# Patient Record
Sex: Female | Born: 1950 | Hispanic: Yes | Marital: Married | Smoking: Never smoker
Health system: Southern US, Community
[De-identification: ages and names within clinical notes are randomized; demographics above are authoritative.]

## PROBLEM LIST (undated history)

## (undated) DIAGNOSIS — Z8719 Personal history of other diseases of the digestive system: Secondary | ICD-10-CM

## (undated) DIAGNOSIS — R1310 Dysphagia, unspecified: Secondary | ICD-10-CM

## (undated) DIAGNOSIS — R51 Headache: Secondary | ICD-10-CM

## (undated) DIAGNOSIS — H269 Unspecified cataract: Secondary | ICD-10-CM

## (undated) DIAGNOSIS — R161 Splenomegaly, not elsewhere classified: Secondary | ICD-10-CM

## (undated) DIAGNOSIS — K219 Gastro-esophageal reflux disease without esophagitis: Secondary | ICD-10-CM

## (undated) DIAGNOSIS — I10 Essential (primary) hypertension: Secondary | ICD-10-CM

## (undated) DIAGNOSIS — E559 Vitamin D deficiency, unspecified: Secondary | ICD-10-CM

## (undated) DIAGNOSIS — J189 Pneumonia, unspecified organism: Secondary | ICD-10-CM

## (undated) DIAGNOSIS — R519 Headache, unspecified: Secondary | ICD-10-CM

## (undated) DIAGNOSIS — F32A Depression, unspecified: Secondary | ICD-10-CM

## (undated) DIAGNOSIS — R600 Localized edema: Secondary | ICD-10-CM

## (undated) DIAGNOSIS — I499 Cardiac arrhythmia, unspecified: Secondary | ICD-10-CM

## (undated) DIAGNOSIS — F329 Major depressive disorder, single episode, unspecified: Secondary | ICD-10-CM

## (undated) DIAGNOSIS — R7303 Prediabetes: Secondary | ICD-10-CM

## (undated) DIAGNOSIS — K7460 Unspecified cirrhosis of liver: Secondary | ICD-10-CM

---

## 2006-09-08 HISTORY — PX: EYE SURGERY: SHX253

## 2015-03-30 ENCOUNTER — Other Ambulatory Visit: Payer: Self-pay | Admitting: Family Medicine

## 2015-03-30 DIAGNOSIS — Z1231 Encounter for screening mammogram for malignant neoplasm of breast: Secondary | ICD-10-CM

## 2015-05-24 ENCOUNTER — Ambulatory Visit: Payer: Self-pay | Attending: Family Medicine

## 2015-10-23 ENCOUNTER — Other Ambulatory Visit: Payer: Self-pay | Admitting: Geriatric Medicine

## 2015-10-23 DIAGNOSIS — Z13820 Encounter for screening for osteoporosis: Secondary | ICD-10-CM

## 2015-10-23 DIAGNOSIS — Z1231 Encounter for screening mammogram for malignant neoplasm of breast: Secondary | ICD-10-CM

## 2015-11-21 ENCOUNTER — Ambulatory Visit: Payer: Self-pay | Attending: Geriatric Medicine

## 2016-01-25 ENCOUNTER — Encounter: Payer: Self-pay | Admitting: *Deleted

## 2016-01-28 ENCOUNTER — Encounter
Admission: RE | Disposition: A | Discharge status: Home / Self Care | Payer: Self-pay | Source: Ambulatory Visit | Attending: Gastroenterology

## 2016-01-28 ENCOUNTER — Ambulatory Visit: Payer: Medicare (Managed Care) | Admitting: Certified Registered Nurse Anesthetist

## 2016-01-28 ENCOUNTER — Ambulatory Visit
Admission: RE | Admit: 2016-01-28 | Discharge: 2016-01-28 | Disposition: A | Discharge status: Home / Self Care | Payer: Medicare (Managed Care) | Source: Ambulatory Visit | Attending: Gastroenterology | Admitting: Gastroenterology

## 2016-01-28 ENCOUNTER — Encounter: Payer: Self-pay | Admitting: *Deleted

## 2016-01-28 DIAGNOSIS — R1310 Dysphagia, unspecified: Secondary | ICD-10-CM | POA: Diagnosis present

## 2016-01-28 DIAGNOSIS — I10 Essential (primary) hypertension: Secondary | ICD-10-CM | POA: Diagnosis not present

## 2016-01-28 DIAGNOSIS — Z1211 Encounter for screening for malignant neoplasm of colon: Secondary | ICD-10-CM | POA: Diagnosis not present

## 2016-01-28 DIAGNOSIS — F329 Major depressive disorder, single episode, unspecified: Secondary | ICD-10-CM | POA: Insufficient documentation

## 2016-01-28 DIAGNOSIS — E559 Vitamin D deficiency, unspecified: Secondary | ICD-10-CM | POA: Insufficient documentation

## 2016-01-28 DIAGNOSIS — Z79899 Other long term (current) drug therapy: Secondary | ICD-10-CM | POA: Insufficient documentation

## 2016-01-28 HISTORY — PX: COLONOSCOPY WITH PROPOFOL: SHX5780

## 2016-01-28 HISTORY — DX: Major depressive disorder, single episode, unspecified: F32.9

## 2016-01-28 HISTORY — DX: Vitamin D deficiency, unspecified: E55.9

## 2016-01-28 HISTORY — PX: ESOPHAGOGASTRODUODENOSCOPY (EGD) WITH PROPOFOL: SHX5813

## 2016-01-28 HISTORY — DX: Essential (primary) hypertension: I10

## 2016-01-28 HISTORY — DX: Depression, unspecified: F32.A

## 2016-01-28 SURGERY — COLONOSCOPY WITH PROPOFOL
Anesthesia: General

## 2016-01-28 MED ORDER — PROPOFOL 500 MG/50ML IV EMUL
INTRAVENOUS | Status: DC | PRN
  Administered 2016-01-28: 120 ug/kg/min via INTRAVENOUS

## 2016-01-28 MED ORDER — SODIUM CHLORIDE 0.9 % IV SOLN
INTRAVENOUS | Status: DC
Start: 2016-01-28 — End: 2016-01-28
  Administered 2016-01-28: 09:00:00 via INTRAVENOUS

## 2016-01-28 MED ORDER — PROPOFOL 10 MG/ML IV BOLUS
INTRAVENOUS | Status: DC | PRN
  Administered 2016-01-28: 40 mg via INTRAVENOUS
  Administered 2016-01-28: 20 mg via INTRAVENOUS

## 2016-01-28 MED ORDER — LIDOCAINE HCL (CARDIAC) 20 MG/ML IV SOLN
INTRAVENOUS | Status: DC | PRN
  Administered 2016-01-28: 100 mg via INTRAVENOUS

## 2016-01-28 MED ORDER — SODIUM CHLORIDE 0.9 % IV SOLN
INTRAVENOUS | Status: DC
Start: 2016-01-28 — End: 2016-01-28

## 2016-01-28 MED ORDER — FENTANYL CITRATE (PF) 100 MCG/2ML IJ SOLN
INTRAMUSCULAR | Status: DC | PRN
  Administered 2016-01-28: 50 ug via INTRAVENOUS

## 2016-01-28 NOTE — Transfer of Care (Signed)
Immediate Anesthesia Transfer of Care Note  Patient: Lindsay Baker  Procedure(s) Performed: Procedure(s): COLONOSCOPY WITH PROPOFOL (N/A) ESOPHAGOGASTRODUODENOSCOPY (EGD) WITH PROPOFOL (N/A)  Patient Location: PACU  Anesthesia Type:General  Level of Consciousness: sedated  Airway & Oxygen Therapy: Patient Spontanous Breathing and Patient connected to nasal cannula oxygen  Post-op Assessment: Report given to RN and Post -op Vital signs reviewed and stable  Post vital signs: Reviewed and stable  Last Vitals:  Filed Vitals:   01/28/16 0845  BP: 141/61  Pulse: 59  Temp: 36.2 C  Resp: 16    Last Pain: There were no vitals filed for this visit.       Complications: No apparent anesthesia complications

## 2016-01-28 NOTE — Op Note (Signed)
[facility] Gastroenterology Patient Name: Lindsay BunchMa Rojas De Jimenez Procedure Date: 01/28/2016 9:03 AM MRN: [id number] Account #: [id number] Date of Birth: Jan 06, 1951 Admit Type: Outpatient Age: 65 [facility] 4 Gender: Female Note Status: Finalized Procedure:            Colonoscopy Indications:          Screening for colorectal malignant neoplasm Providers:            Ezzard StandingPaul Y. Bluford Kaufmannh, MD Medicines:            Monitored Anesthesia Care Complications:        No immediate complications. Procedure:            Pre-Anesthesia Assessment:                       - Prior to the procedure, a History and Physical was                        performed, and patient medications, allergies and                        sensitivities were reviewed. The patient's tolerance of                        previous anesthesia was reviewed.                       - The risks and benefits of the procedure and the                        sedation options and risks were discussed with the                        patient. All questions were answered and informed                        consent was obtained.                       - After reviewing the risks and benefits, the patient                        was deemed in satisfactory condition to undergo the                        procedure.                       After obtaining informed consent, the colonoscope was                        passed under direct vision. Throughout the procedure,                        the patient's blood pressure, pulse, and oxygen                        saturations were monitored continuously. The                        Colonoscope was introduced through the anus and  advanced to the the cecum, identified by appendiceal                        orifice and ileocecal valve. The colonoscopy was                        performed without difficulty. The patient tolerated the                        procedure  well. The quality of the bowel preparation                        was poor. Findings:      The colon (entire examined portion) appeared normal.      Solid stools near cecum. Impression:           - Preparation of the colon was poor.                       - The entire examined colon is normal.                       - No specimens collected. Recommendation:       - Discharge patient to home.                       - Repeat colonoscopy in 10 years for surveillance.                       - The findings and recommendations were discussed with                        the patient. Procedure Code(s):    --- Professional ---                       [phone], Colonoscopy, flexible; diagnostic, including                        collection of specimen(s) by brushing or washing, when                        performed (separate procedure) Diagnosis Code(s):    --- Professional ---                       Z12.11, Encounter for screening for malignant neoplasm                        of colon CPT copyright 2016 American Medical Association. All rights reserved. The codes documented in this report are preliminary and upon coder review may  be revised to meet current compliance requirements. Wallace Cullens, MD 01/28/2016 9:26:27 AM This report has been signed electronically. Number of Addenda: 0 Note Initiated On: 01/28/2016 9:03 AM Scope Withdrawal Time: 0 hours 5 minutes 42 seconds  Total Procedure Duration: 0 hours 9 minutes 43 seconds       [facility]

## 2016-01-28 NOTE — Op Note (Signed)
[facility] Gastroenterology Patient Name: Lindsay Baker Procedure Date: 01/28/2016 9:04 AM MRN: [id number] Account #: [id number] Date of Birth: [birth year] Admit Type: Outpatient Age: 65 Room: [facility] ENDO ROOM 4 Gender: Female Note Status: Finalized Procedure:            Upper GI endoscopy Indications:          Dysphagia Providers:            Ezzard Standing. Bluford Kaufmann, MD Medicines:            Monitored Anesthesia Care Complications:        No immediate complications. Procedure:            Pre-Anesthesia Assessment:                       - Prior to the procedure, a History and Physical was                        performed, and patient medications, allergies and                        sensitivities were reviewed. The patient's tolerance of                        previous anesthesia was reviewed.                       - The risks and benefits of the procedure and the                        sedation options and risks were discussed with the                        patient. All questions were answered and informed                        consent was obtained.                       - After reviewing the risks and benefits, the patient                        was deemed in satisfactory condition to undergo the                        procedure.                       After obtaining informed consent, the endoscope was                        passed under direct vision. Throughout the procedure,                        the patient's blood pressure, pulse, and oxygen                        saturations were monitored continuously. The Endoscope                        was introduced through the mouth, and advanced to the  second part of duodenum. The upper GI endoscopy was                        accomplished without difficulty. The patient tolerated                        the procedure well. Findings:      No endoscopic abnormality was evident in the esophagus to  explain the       patient's complaint of dysphagia. It was decided, however, to proceed       with dilation of the entire esophagus. The scope was withdrawn. Dilation       was performed with a Maloney dilator with mild resistance at 54 Fr.      The entire examined stomach was normal.      The examined duodenum was normal. Impression:           - No endoscopic esophageal abnormality to explain                        patient's dysphagia. Esophagus dilated. Dilated.                       - Normal stomach.                       - Normal examined duodenum.                       - No specimens collected. Recommendation:       - Discharge patient to home.                       - Observe patient's clinical course.                       - The findings and recommendations were discussed with                        the patient. Procedure Code(s):    --- Professional ---                       [phone], Esophagogastroduodenoscopy, flexible, transoral;                        diagnostic, including collection of specimen(s) by                        brushing or washing, when performed (separate procedure)                       43450, Dilation of esophagus, by unguided sound or                        bougie, single or multiple passes Diagnosis Code(s):    --- Professional ---                       R13.10, Dysphagia, unspecified CPT copyright 2016 American Medical Association. All rights reserved. The codes documented in this report are preliminary and upon coder review may  be revised to meet current compliance requirements. Wallace Cullens, MD 01/28/2016 9:12:58 AM This report has been signed electronically. Number of  Addenda: 0 Note Initiated On: 01/28/2016 9:04 AM      [facility]

## 2016-01-28 NOTE — Anesthesia Postprocedure Evaluation (Signed)
Anesthesia Post Note  Patient: Lindsay Baker  Procedure(s) Performed: Procedure(s) (LRB): COLONOSCOPY WITH PROPOFOL (N/A) ESOPHAGOGASTRODUODENOSCOPY (EGD) WITH PROPOFOL (N/A)  Patient location during evaluation: PACU Anesthesia Type: General Level of consciousness: awake and alert Pain management: pain level controlled Vital Signs Assessment: post-procedure vital signs reviewed and stable Respiratory status: spontaneous breathing, nonlabored ventilation, respiratory function stable and patient connected to nasal cannula oxygen Cardiovascular status: blood pressure returned to baseline and stable Postop Assessment: no signs of nausea or vomiting Anesthetic complications: no    Last Vitals:  Filed Vitals:   01/28/16 0845 01/28/16 0930  BP: 141/61 144/71  Pulse: 59 68  Temp: 36.2 C 36.6 C  Resp: 16 20    Last Pain: There were no vitals filed for this visit.               Yevette EdwardsJames G Adams

## 2016-01-28 NOTE — H&P (Signed)
    [facility] Primary Gastroenterologist:  Dr. Bluford Kaufmannh  Pre-Procedure History & Physical: HPI:  Lindsay Baker is a 65 y.o. female is here for an EGD/colonoscopy.   Past Medical History  Diagnosis Date  . Depression   . Hypertension   . Vitamin D deficiency     No past surgical history on file.  Prior to Admission medications   Medication Sig Start Date End Date Taking? Authorizing Provider  amLODipine (NORVASC) 10 MG tablet Take 10 mg by mouth daily.   Yes Historical Provider, MD  escitalopram (LEXAPRO) 10 MG tablet Take 10 mg by mouth daily.   Yes Historical Provider, MD  hydrochlorothiazide (HYDRODIURIL) 25 MG tablet Take 25 mg by mouth daily.   Yes Historical Provider, MD  lisinopril (PRINIVIL,ZESTRIL) 20 MG tablet Take 20 mg by mouth daily.   Yes Historical Provider, MD  traMADol (ULTRAM) 50 MG tablet Take by mouth every 6 (six) hours as needed.   Yes Historical Provider, MD    Allergies as of 01/18/2016  . (Not on File)    No family history on file.  Social History   Social History  . Marital Status: Married    Spouse Name: N/A  . Number of Children: N/A  . Years of Education: N/A   Occupational History  . Not on file.   Social History Main Topics  . Smoking status: Not on file  . Smokeless tobacco: Not on file  . Alcohol Use: Not on file  . Drug Use: Not on file  . Sexual Activity: Not on file   Other Topics Concern  . Not on file   Social History Narrative  . No narrative on file    Review of Systems: See HPI, otherwise negative ROS  Physical Exam: There were no vitals taken for this visit. General:   Alert,  pleasant and cooperative in NAD Head:  Normocephalic and atraumatic. Neck:  Supple; no masses or thyromegaly. Lungs:  Clear throughout to auscultation.    Heart:  Regular rate and rhythm. Abdomen:  Soft, nontender and nondistended. Normal bowel sounds, without guarding, and without rebound.    Neurologic:  Alert and  oriented x4;  grossly normal neurologically.  Impression/Plan: Tacy A Chandra BatchRojas De Baker is here for an EGD/colonoscopy to be performed for screening, GERD, dysphagia  Risks, benefits, limitations, and alternatives regarding  EGD/colonoscopy} have been reviewed with the patient.  Questions have been answered.  All parties agreeable.   Eldine Rencher, Ezzard StandingPAUL Y, MD  01/28/2016, 8:27 AM

## 2016-01-28 NOTE — Anesthesia Preprocedure Evaluation (Signed)
Anesthesia Evaluation  Patient identified by MRN, date of birth, ID band Patient awake    Reviewed: Allergy & Precautions, H&P , NPO status , Patient's Chart, lab work & pertinent test results, reviewed documented beta blocker date and time   Airway Mallampati: II   Neck ROM: full    Dental  (+) Poor Dentition, Teeth Intact   Pulmonary neg pulmonary ROS,    Pulmonary exam normal        Cardiovascular hypertension, negative cardio ROS Normal cardiovascular exam     Neuro/Psych PSYCHIATRIC DISORDERS negative neurological ROS  negative psych ROS   GI/Hepatic negative GI ROS, Neg liver ROS,   Endo/Other  negative endocrine ROS  Renal/GU negative Renal ROS  negative genitourinary   Musculoskeletal   Abdominal   Peds  Hematology negative hematology ROS (+)   Anesthesia Other Findings Past Medical History:   Depression                                                   Hypertension                                                 Vitamin D deficiency                                       History reviewed. No pertinent surgical history. BMI    Body Mass Index   35.15 kg/m 2     Reproductive/Obstetrics                             Anesthesia Physical Anesthesia Plan  ASA: II  Anesthesia Plan: General   Post-op Pain Management:    Induction:   Airway Management Planned:   Additional Equipment:   Intra-op Plan:   Post-operative Plan:   Informed Consent: I have reviewed the patients History and Physical, chart, labs and discussed the procedure including the risks, benefits and alternatives for the proposed anesthesia with the patient or authorized representative who has indicated his/her understanding and acceptance.   Dental Advisory Given  Plan Discussed with: CRNA  Anesthesia Plan Comments:         Anesthesia Quick Evaluation

## 2016-01-29 ENCOUNTER — Encounter: Payer: Self-pay | Admitting: Gastroenterology

## 2017-04-17 ENCOUNTER — Other Ambulatory Visit: Payer: Self-pay | Admitting: Geriatric Medicine

## 2017-04-17 DIAGNOSIS — Z13820 Encounter for screening for osteoporosis: Secondary | ICD-10-CM

## 2017-05-20 ENCOUNTER — Other Ambulatory Visit: Payer: Self-pay | Admitting: Internal Medicine

## 2017-05-20 DIAGNOSIS — Z1231 Encounter for screening mammogram for malignant neoplasm of breast: Secondary | ICD-10-CM

## 2017-06-11 ENCOUNTER — Ambulatory Visit
Admission: RE | Admit: 2017-06-11 | Discharge: 2017-06-11 | Disposition: A | Discharge status: Home / Self Care | Payer: Medicare (Managed Care) | Source: Ambulatory Visit | Attending: Internal Medicine | Admitting: Internal Medicine

## 2017-06-11 DIAGNOSIS — Z1231 Encounter for screening mammogram for malignant neoplasm of breast: Secondary | ICD-10-CM | POA: Insufficient documentation

## 2017-12-07 DIAGNOSIS — H269 Unspecified cataract: Secondary | ICD-10-CM

## 2017-12-07 HISTORY — DX: Unspecified cataract: H26.9

## 2017-12-15 ENCOUNTER — Encounter: Payer: Self-pay | Admitting: *Deleted

## 2017-12-22 ENCOUNTER — Ambulatory Visit: Payer: Medicare (Managed Care) | Admitting: Anesthesiology

## 2017-12-22 ENCOUNTER — Other Ambulatory Visit: Payer: Self-pay

## 2017-12-22 ENCOUNTER — Encounter: Payer: Self-pay | Admitting: *Deleted

## 2017-12-22 ENCOUNTER — Encounter
Admission: RE | Disposition: A | Discharge status: Home / Self Care | Payer: Self-pay | Source: Ambulatory Visit | Attending: Ophthalmology

## 2017-12-22 ENCOUNTER — Ambulatory Visit
Admission: RE | Admit: 2017-12-22 | Discharge: 2017-12-22 | Disposition: A | Discharge status: Home / Self Care | Payer: Medicare (Managed Care) | Source: Ambulatory Visit | Attending: Ophthalmology | Admitting: Ophthalmology

## 2017-12-22 DIAGNOSIS — R600 Localized edema: Secondary | ICD-10-CM | POA: Diagnosis not present

## 2017-12-22 DIAGNOSIS — I10 Essential (primary) hypertension: Secondary | ICD-10-CM | POA: Insufficient documentation

## 2017-12-22 DIAGNOSIS — E669 Obesity, unspecified: Secondary | ICD-10-CM | POA: Insufficient documentation

## 2017-12-22 DIAGNOSIS — E559 Vitamin D deficiency, unspecified: Secondary | ICD-10-CM | POA: Insufficient documentation

## 2017-12-22 DIAGNOSIS — H2512 Age-related nuclear cataract, left eye: Secondary | ICD-10-CM | POA: Diagnosis present

## 2017-12-22 DIAGNOSIS — Z6833 Body mass index (BMI) 33.0-33.9, adult: Secondary | ICD-10-CM | POA: Insufficient documentation

## 2017-12-22 DIAGNOSIS — K219 Gastro-esophageal reflux disease without esophagitis: Secondary | ICD-10-CM | POA: Diagnosis not present

## 2017-12-22 DIAGNOSIS — F329 Major depressive disorder, single episode, unspecified: Secondary | ICD-10-CM | POA: Diagnosis not present

## 2017-12-22 DIAGNOSIS — Z79899 Other long term (current) drug therapy: Secondary | ICD-10-CM | POA: Diagnosis not present

## 2017-12-22 HISTORY — DX: Gastro-esophageal reflux disease without esophagitis: K21.9

## 2017-12-22 HISTORY — DX: Pneumonia, unspecified organism: J18.9

## 2017-12-22 HISTORY — DX: Prediabetes: R73.03

## 2017-12-22 HISTORY — DX: Localized edema: R60.0

## 2017-12-22 HISTORY — DX: Cardiac arrhythmia, unspecified: I49.9

## 2017-12-22 HISTORY — PX: CATARACT EXTRACTION W/PHACO: SHX586

## 2017-12-22 HISTORY — DX: Unspecified cataract: H26.9

## 2017-12-22 HISTORY — DX: Personal history of other diseases of the digestive system: Z87.19

## 2017-12-22 HISTORY — DX: Headache: R51

## 2017-12-22 HISTORY — DX: Headache, unspecified: R51.9

## 2017-12-22 SURGERY — PHACOEMULSIFICATION, CATARACT, WITH IOL INSERTION
Anesthesia: Monitor Anesthesia Care | Site: Eye | Laterality: Left | Wound class: Clean

## 2017-12-22 MED ORDER — LIDOCAINE HCL (PF) 4 % IJ SOLN
INTRAOCULAR | Status: DC | PRN
  Administered 2017-12-22: 2 mL via OPHTHALMIC

## 2017-12-22 MED ORDER — NA CHONDROIT SULF-NA HYALURON 40-17 MG/ML IO SOLN
INTRAOCULAR | Status: DC | PRN
  Administered 2017-12-22: 1 mL via INTRAOCULAR

## 2017-12-22 MED ORDER — ARMC OPHTHALMIC DILATING DROPS
1.0000 "application " | OPHTHALMIC | Status: AC
  Administered 2017-12-22 (×3): 1 via OPHTHALMIC

## 2017-12-22 MED ORDER — MIDAZOLAM HCL 2 MG/2ML IJ SOLN
INTRAMUSCULAR | Status: DC | PRN
  Administered 2017-12-22: 2 mg via INTRAVENOUS

## 2017-12-22 MED ORDER — CARBACHOL 0.01 % IO SOLN
INTRAOCULAR | Status: DC | PRN
  Administered 2017-12-22: .5 mL via INTRAOCULAR

## 2017-12-22 MED ORDER — EPINEPHRINE PF 1 MG/ML IJ SOLN
INTRAOCULAR | Status: DC | PRN
  Administered 2017-12-22: 1 mL via OPHTHALMIC

## 2017-12-22 MED ORDER — MIDAZOLAM HCL 2 MG/2ML IJ SOLN
INTRAMUSCULAR | Status: AC
  Filled 2017-12-22: qty 2

## 2017-12-22 MED ORDER — MOXIFLOXACIN HCL 0.5 % OP SOLN
OPHTHALMIC | Status: DC | PRN
  Administered 2017-12-22: .2 mL via OPHTHALMIC

## 2017-12-22 MED ORDER — POVIDONE-IODINE 5 % OP SOLN
OPHTHALMIC | Status: DC | PRN
  Administered 2017-12-22: 1 via OPHTHALMIC

## 2017-12-22 MED ORDER — MOXIFLOXACIN HCL 0.5 % OP SOLN: 1.0000 [drp] | Freq: Once | OPHTHALMIC | Status: DC

## 2017-12-22 MED ORDER — MOXIFLOXACIN HCL 0.5 % OP SOLN
OPHTHALMIC | Status: AC
  Filled 2017-12-22: qty 3

## 2017-12-22 MED ORDER — FENTANYL CITRATE (PF) 100 MCG/2ML IJ SOLN
INTRAMUSCULAR | Status: AC
  Filled 2017-12-22: qty 2

## 2017-12-22 MED ORDER — SODIUM CHLORIDE 0.9 % IV SOLN
INTRAVENOUS | Status: DC
  Administered 2017-12-22: 08:00:00 via INTRAVENOUS

## 2017-12-22 MED ORDER — FENTANYL CITRATE (PF) 100 MCG/2ML IJ SOLN
INTRAMUSCULAR | Status: DC | PRN
  Administered 2017-12-22: 25 ug via INTRAVENOUS

## 2017-12-22 SURGICAL SUPPLY — 16 items
GLOVE BIO SURGEON STRL SZ8 (GLOVE) ×3 IMPLANT
GLOVE BIOGEL M 6.5 STRL (GLOVE) ×3 IMPLANT
GLOVE SURG LX 8.0 MICRO (GLOVE) ×2
GLOVE SURG LX STRL 8.0 MICRO (GLOVE) ×1 IMPLANT
GOWN STRL REUS W/ TWL LRG LVL3 (GOWN DISPOSABLE) ×2 IMPLANT
GOWN STRL REUS W/TWL LRG LVL3 (GOWN DISPOSABLE) ×4
LABEL CATARACT MEDS ST (LABEL) ×3 IMPLANT
LENS IOL TECNIS ITEC 23.5 (Intraocular Lens) ×3 IMPLANT
PACK CATARACT (MISCELLANEOUS) ×3 IMPLANT
PACK CATARACT BRASINGTON LX (MISCELLANEOUS) ×3 IMPLANT
PACK EYE AFTER SURG (MISCELLANEOUS) ×3 IMPLANT
SOL BSS BAG (MISCELLANEOUS) ×3
SOLUTION BSS BAG (MISCELLANEOUS) ×1 IMPLANT
SYR 5ML LL (SYRINGE) ×3 IMPLANT
WATER STERILE IRR 250ML POUR (IV SOLUTION) ×3 IMPLANT
WIPE NON LINTING 3.25X3.25 (MISCELLANEOUS) ×3 IMPLANT

## 2017-12-22 NOTE — H&P (Signed)
All labs reviewed. Abnormal studies sent to patients PCP when indicated.  Previous H&P reviewed, patient examined, there are NO CHANGES.  Lindsay Warne Porfilio4/16/20198:20 AM

## 2017-12-22 NOTE — OR Nursing (Signed)
Interpreter went over instructions with pt and daughter and I. All voice understanding

## 2017-12-22 NOTE — Discharge Instructions (Signed)
Eye Surgery Discharge Instructions  Expect mild scratchy sensation or mild soreness. DO NOT RUB YOUR EYE!  The day of surgery:  Minimal physical activity, but bed rest is not required  No reading, computer work, or close hand work  No bending, lifting, or straining.  May watch TV  For 24 hours:  No driving, legal decisions, or alcoholic beverages  Safety precautions  Eat anything you prefer: It is better to start with liquids, then soup then solid foods.  _____ Eye patch should be worn until postoperative exam tomorrow.  ____ Solar shield eyeglasses should be worn for comfort in the sunlight/patch while sleeping  Resume all regular medications including aspirin or Coumadin if these were discontinued prior to surgery. You may shower, bathe, shave, or wash your hair. Tylenol may be taken for mild discomfort.  Call your doctor if you experience significant pain, nausea, or vomiting, fever > 101 or other signs of infection. [phone] or [phone] Specific instructions:  Follow-up Information    Galen ManilaPorfilio, William, MD Follow up.   Specialty:  Ophthalmology Why:  April 17 at 1:40pm Contact information: [address] [place] [address] [phone]

## 2017-12-22 NOTE — Op Note (Signed)
PREOPERATIVE DIAGNOSIS:  Nuclear sclerotic cataract of the left eye.   POSTOPERATIVE DIAGNOSIS:  Nuclear sclerotic cataract of the left eye.   OPERATIVE PROCEDURE: Procedure(s): CATARACT EXTRACTION PHACO AND INTRAOCULAR LENS PLACEMENT (IOC)   SURGEON:  Galen ManilaWilliam Orman Matsumura, MD.   ANESTHESIA:  Anesthesiologist: Alver FisherPenwarden, Amy, MD CRNA: Irving BurtonBachich, Jennifer, CRNA  1.      Managed anesthesia care. 2.     0.1061ml of Shugarcaine was instilled following the paracentesis   COMPLICATIONS:  None.   TECHNIQUE:   Stop and chop   DESCRIPTION OF PROCEDURE:  The patient was examined and consented in the preoperative holding area where the aforementioned topical anesthesia was applied to the left eye and then brought back to the Operating Room where the left eye was prepped and draped in the usual sterile ophthalmic fashion and a lid speculum was placed. A paracentesis was created with the side port blade and the anterior chamber was filled with viscoelastic. A near clear corneal incision was performed with the steel keratome. A continuous curvilinear capsulorrhexis was performed with a cystotome followed by the capsulorrhexis forceps. Hydrodissection and hydrodelineation were carried out with BSS on a blunt cannula. The lens was removed in a stop and chop  technique and the remaining cortical material was removed with the irrigation-aspiration handpiece. The capsular bag was inflated with viscoelastic and the Technis [id number] lens was placed in the capsular bag without complication. The remaining viscoelastic was removed from the eye with the irrigation-aspiration handpiece. The wounds were hydrated. The anterior chamber was flushed with Miostat and the eye was inflated to physiologic pressure. 0.731ml Vigamox was placed in the anterior chamber. The wounds were found to be water tight. The eye was dressed with Vigamox. The patient was given protective glasses to wear throughout the day and a shield with which to sleep  tonight. The patient was also given drops with which to begin a drop regimen today and will follow-up with me in one day. Implant Name Type Inv. Item Serial No. Manufacturer Lot No. LRB No. Used  LENS IOL DIOP 23.5 - [id number] Intraocular Lens LENS IOL DIOP 23.5 [id number]  Left 1    Procedure(s) with comments: CATARACT EXTRACTION PHACO AND INTRAOCULAR LENS PLACEMENT (IOC) (Left) - US 00:19 AP% 11.5 CDE 2.22 Fluid pack lot # [id number]  Electronically signed: Galen ManilaWilliam Robbi Scurlock 12/22/2017 8:42 AM

## 2017-12-22 NOTE — Transfer of Care (Signed)
Immediate Anesthesia Transfer of Care Note  Patient: Lindsay Baker  Procedure(s) Performed: CATARACT EXTRACTION PHACO AND INTRAOCULAR LENS PLACEMENT (IOC) (Left Eye)  Patient Location: Short Stay  Anesthesia Type:MAC  Level of Consciousness: awake, alert  and oriented  Airway & Oxygen Therapy: Patient Spontanous Breathing  Post-op Assessment: Post -op Vital signs reviewed and stable  Post vital signs: stable  Last Vitals:  Vitals Value Taken Time  BP 118/54 12/22/2017  8:45 AM  Temp 36.6 C 12/22/2017  8:45 AM  Pulse    Resp 12 12/22/2017  8:45 AM  SpO2 96 % 12/22/2017  8:45 AM    Last Pain:  Vitals:   12/22/17 0845  TempSrc: Temporal         Complications: No apparent anesthesia complications

## 2017-12-22 NOTE — Anesthesia Postprocedure Evaluation (Signed)
Anesthesia Post Note  Patient: Kaelynne A Bayard Beaver  Procedure(s) Performed: CATARACT EXTRACTION PHACO AND INTRAOCULAR LENS PLACEMENT ([place]) (Left Eye)  Patient location during evaluation: Short Stay Anesthesia Type: MAC Level of consciousness: awake and alert Pain management: pain level controlled Vital Signs Assessment: post-procedure vital signs reviewed and stable Respiratory status: spontaneous breathing, nonlabored ventilation, respiratory function stable and patient connected to nasal cannula oxygen Cardiovascular status: stable and blood pressure returned to baseline Postop Assessment: no apparent nausea or vomiting Anesthetic complications: no     Last Vitals:  Vitals:   12/22/17 0752 12/22/17 0845  BP: 135/69 (!) 118/54  Pulse: 60 (!) 57  Resp: 15 15  Temp: (!) 35.6 C 36.6 C  SpO2: 100% 95%    Last Pain:  Vitals:   12/22/17 0845  TempSrc: Temporal  PainSc: 0-No pain                 Rhoda Waldvogel,  Clearnce Sorrel

## 2017-12-22 NOTE — Anesthesia Post-op Follow-up Note (Signed)
Anesthesia QCDR form completed.        

## 2017-12-22 NOTE — Anesthesia Preprocedure Evaluation (Signed)
Anesthesia Evaluation  Patient identified by MRN, date of birth, ID band Patient awake    Reviewed: Allergy & Precautions, NPO status , Patient's Chart, lab work & pertinent test results  History of Anesthesia Complications Negative for: history of anesthetic complications  Airway Mallampati: III  TM Distance: >3 FB Neck ROM: Full    Dental no notable dental hx.    Pulmonary neg pulmonary ROS, neg sleep apnea, neg COPD,    breath sounds clear to auscultation- rhonchi (-) wheezing      Cardiovascular hypertension, Pt. on medications (-) CAD, (-) Past MI, (-) Cardiac Stents and (-) CABG  Rhythm:Regular Rate:Normal - Systolic murmurs and - Diastolic murmurs    Neuro/Psych  Headaches, PSYCHIATRIC DISORDERS Depression    GI/Hepatic Neg liver ROS, hiatal hernia, GERD  ,  Endo/Other  negative endocrine ROSneg diabetes  Renal/GU negative Renal ROS     Musculoskeletal negative musculoskeletal ROS (+)   Abdominal (+) + obese,   Peds  Hematology negative hematology ROS (+)   Anesthesia Other Findings Past Medical History: No date: Bilateral lower extremity edema 12/2017: Cataract     Comment:  left No date: Depression No date: Dysrhythmia No date: GERD (gastroesophageal reflux disease) No date: Headache No date: History of hiatal hernia No date: Hypertension No date: Pneumonia     Comment:  history of bronchitis No date: Vitamin D deficiency   Reproductive/Obstetrics                             Anesthesia Physical  Anesthesia Plan  ASA: II  Anesthesia Plan: MAC   Post-op Pain Management:    Induction: Intravenous  PONV Risk Score and Plan: 2 and Midazolam  Airway Management Planned: Natural Airway  Additional Equipment:   Intra-op Plan:   Post-operative Plan:   Informed Consent: I have reviewed the patients History and Physical, chart, labs and discussed the procedure  including the risks, benefits and alternatives for the proposed anesthesia with the patient or authorized representative who has indicated his/her understanding and acceptance.     Plan Discussed with: CRNA and Anesthesiologist  Anesthesia Plan Comments:         Anesthesia Quick Evaluation  

## 2018-02-15 ENCOUNTER — Encounter: Payer: Self-pay | Admitting: *Deleted

## 2018-02-16 ENCOUNTER — Encounter
Admission: RE | Disposition: A | Discharge status: Home / Self Care | Payer: Self-pay | Source: Ambulatory Visit | Attending: Ophthalmology

## 2018-02-16 ENCOUNTER — Encounter: Payer: Self-pay | Admitting: *Deleted

## 2018-02-16 ENCOUNTER — Ambulatory Visit: Payer: Medicare (Managed Care) | Admitting: Anesthesiology

## 2018-02-16 ENCOUNTER — Other Ambulatory Visit: Payer: Self-pay

## 2018-02-16 ENCOUNTER — Ambulatory Visit
Admission: RE | Admit: 2018-02-16 | Discharge: 2018-02-16 | Disposition: A | Discharge status: Home / Self Care | Payer: Medicare (Managed Care) | Source: Ambulatory Visit | Attending: Ophthalmology | Admitting: Ophthalmology

## 2018-02-16 DIAGNOSIS — H2511 Age-related nuclear cataract, right eye: Secondary | ICD-10-CM | POA: Insufficient documentation

## 2018-02-16 DIAGNOSIS — R609 Edema, unspecified: Secondary | ICD-10-CM | POA: Insufficient documentation

## 2018-02-16 DIAGNOSIS — E669 Obesity, unspecified: Secondary | ICD-10-CM | POA: Insufficient documentation

## 2018-02-16 DIAGNOSIS — K449 Diaphragmatic hernia without obstruction or gangrene: Secondary | ICD-10-CM | POA: Diagnosis not present

## 2018-02-16 DIAGNOSIS — K219 Gastro-esophageal reflux disease without esophagitis: Secondary | ICD-10-CM | POA: Diagnosis not present

## 2018-02-16 DIAGNOSIS — I10 Essential (primary) hypertension: Secondary | ICD-10-CM | POA: Insufficient documentation

## 2018-02-16 DIAGNOSIS — Z6834 Body mass index (BMI) 34.0-34.9, adult: Secondary | ICD-10-CM | POA: Diagnosis not present

## 2018-02-16 DIAGNOSIS — I499 Cardiac arrhythmia, unspecified: Secondary | ICD-10-CM | POA: Diagnosis not present

## 2018-02-16 DIAGNOSIS — E559 Vitamin D deficiency, unspecified: Secondary | ICD-10-CM | POA: Diagnosis not present

## 2018-02-16 DIAGNOSIS — F329 Major depressive disorder, single episode, unspecified: Secondary | ICD-10-CM | POA: Diagnosis not present

## 2018-02-16 DIAGNOSIS — R51 Headache: Secondary | ICD-10-CM | POA: Diagnosis not present

## 2018-02-16 DIAGNOSIS — Z9842 Cataract extraction status, left eye: Secondary | ICD-10-CM | POA: Diagnosis not present

## 2018-02-16 HISTORY — PX: CATARACT EXTRACTION W/PHACO: SHX586

## 2018-02-16 LAB — GLUCOSE, CAPILLARY: Glucose-Capillary: 97 mg/dL (ref 65–99)

## 2018-02-16 SURGERY — PHACOEMULSIFICATION, CATARACT, WITH IOL INSERTION
Anesthesia: Monitor Anesthesia Care | Site: Eye | Laterality: Right | Wound class: Clean

## 2018-02-16 MED ORDER — POVIDONE-IODINE 5 % OP SOLN
OPHTHALMIC | Status: DC | PRN
  Administered 2018-02-16: 1 via OPHTHALMIC

## 2018-02-16 MED ORDER — EPINEPHRINE PF 1 MG/ML IJ SOLN
INTRAOCULAR | Status: DC | PRN
  Administered 2018-02-16: 1 mL via OPHTHALMIC

## 2018-02-16 MED ORDER — EPINEPHRINE PF 1 MG/ML IJ SOLN
INTRAMUSCULAR | Status: AC
  Filled 2018-02-16: qty 1

## 2018-02-16 MED ORDER — MOXIFLOXACIN HCL 0.5 % OP SOLN: 1.0000 [drp] | OPHTHALMIC | Status: DC | PRN

## 2018-02-16 MED ORDER — NA CHONDROIT SULF-NA HYALURON 40-17 MG/ML IO SOLN
INTRAOCULAR | Status: DC | PRN
  Administered 2018-02-16: 1 mL via INTRAOCULAR

## 2018-02-16 MED ORDER — MIDAZOLAM HCL 2 MG/2ML IJ SOLN
INTRAMUSCULAR | Status: AC
  Filled 2018-02-16: qty 2

## 2018-02-16 MED ORDER — ARMC OPHTHALMIC DILATING DROPS
OPHTHALMIC | Status: AC
  Filled 2018-02-16: qty 0.4

## 2018-02-16 MED ORDER — NA CHONDROIT SULF-NA HYALURON 40-17 MG/ML IO SOLN
INTRAOCULAR | Status: AC
  Filled 2018-02-16: qty 1

## 2018-02-16 MED ORDER — MOXIFLOXACIN HCL 0.5 % OP SOLN
OPHTHALMIC | Status: AC
  Filled 2018-02-16: qty 3

## 2018-02-16 MED ORDER — LIDOCAINE HCL (PF) 4 % IJ SOLN
INTRAMUSCULAR | Status: AC
  Filled 2018-02-16: qty 5

## 2018-02-16 MED ORDER — MIDAZOLAM HCL 2 MG/2ML IJ SOLN
INTRAMUSCULAR | Status: DC | PRN
  Administered 2018-02-16: 1 mg via INTRAVENOUS

## 2018-02-16 MED ORDER — CARBACHOL 0.01 % IO SOLN
INTRAOCULAR | Status: DC | PRN
  Administered 2018-02-16: .5 mL via INTRAOCULAR

## 2018-02-16 MED ORDER — MOXIFLOXACIN HCL 0.5 % OP SOLN
OPHTHALMIC | Status: DC | PRN
  Administered 2018-02-16: .2 mL via OPHTHALMIC

## 2018-02-16 MED ORDER — LIDOCAINE HCL (PF) 4 % IJ SOLN
INTRAOCULAR | Status: DC | PRN
  Administered 2018-02-16: 2 mL via OPHTHALMIC

## 2018-02-16 MED ORDER — POVIDONE-IODINE 5 % OP SOLN
OPHTHALMIC | Status: AC
  Filled 2018-02-16: qty 30

## 2018-02-16 MED ORDER — SODIUM CHLORIDE 0.9 % IV SOLN
INTRAVENOUS | Status: DC
  Administered 2018-02-16: 07:00:00 via INTRAVENOUS

## 2018-02-16 MED ORDER — ARMC OPHTHALMIC DILATING DROPS
1.0000 "application " | OPHTHALMIC | Status: AC
  Administered 2018-02-16: 1 via OPHTHALMIC
  Administered 2018-02-16: 07:00:00 via OPHTHALMIC
  Administered 2018-02-16: 1 via OPHTHALMIC

## 2018-02-16 SURGICAL SUPPLY — 16 items
GLOVE BIO SURGEON STRL SZ8 (GLOVE) ×3 IMPLANT
GLOVE BIOGEL M 6.5 STRL (GLOVE) ×3 IMPLANT
GLOVE SURG LX 8.0 MICRO (GLOVE) ×2
GLOVE SURG LX STRL 8.0 MICRO (GLOVE) ×1 IMPLANT
GOWN STRL REUS W/ TWL LRG LVL3 (GOWN DISPOSABLE) ×2 IMPLANT
GOWN STRL REUS W/TWL LRG LVL3 (GOWN DISPOSABLE) ×4
LABEL CATARACT MEDS ST (LABEL) ×3 IMPLANT
LENS IOL TECNIS ITEC 23.5 (Intraocular Lens) ×3 IMPLANT
PACK CATARACT (MISCELLANEOUS) ×3 IMPLANT
PACK CATARACT BRASINGTON LX (MISCELLANEOUS) ×3 IMPLANT
PACK EYE AFTER SURG (MISCELLANEOUS) ×3 IMPLANT
SOL BSS BAG (MISCELLANEOUS) ×3
SOLUTION BSS BAG (MISCELLANEOUS) ×1 IMPLANT
SYR 5ML LL (SYRINGE) ×3 IMPLANT
WATER STERILE IRR 250ML POUR (IV SOLUTION) ×3 IMPLANT
WIPE NON LINTING 3.25X3.25 (MISCELLANEOUS) ×3 IMPLANT

## 2018-02-16 NOTE — Transfer of Care (Signed)
Immediate Anesthesia Transfer of Care Note  Patient: Lindsay Baker Beaver  Procedure(s) Performed: CATARACT EXTRACTION PHACO AND INTRAOCULAR LENS PLACEMENT (IOC) (Right Eye)  Patient Location: Short Stay  Anesthesia Type:MAC  Level of Consciousness: awake, alert , oriented and patient cooperative  Airway & Oxygen Therapy: Patient Spontanous Breathing  Post-op Assessment: Report given to RN and Post -op Vital signs reviewed and stable  Post vital signs: Reviewed and stable  Last Vitals:  Vitals Value Taken Time  BP    Temp    Pulse    Resp    SpO2      Last Pain:  Vitals:   02/16/18 0650  TempSrc: Oral         Complications: No apparent anesthesia complications

## 2018-02-16 NOTE — Op Note (Signed)
PREOPERATIVE DIAGNOSIS:  Nuclear sclerotic cataract of the right eye.   POSTOPERATIVE DIAGNOSIS:  nuclear sclerotic cataract right eye   OPERATIVE PROCEDURE: Procedure(s): CATARACT EXTRACTION PHACO AND INTRAOCULAR LENS PLACEMENT (IOC)   SURGEON:  Galen ManilaWilliam Talibah Colasurdo, MD.   ANESTHESIA:  Anesthesiologist: Yves Dillarroll, Paul, MD CRNA: Dava NajjarFrazier, Susan, CRNA  1.      Managed anesthesia care. 2.      0.301ml of Shugarcaine was instilled in the eye following the paracentesis.   COMPLICATIONS:  None.   TECHNIQUE:   Stop and chop   DESCRIPTION OF PROCEDURE:  The patient was examined and consented in the preoperative holding area where the aforementioned topical anesthesia was applied to the right eye and then brought back to the Operating Room where the right eye was prepped and draped in the usual sterile ophthalmic fashion and a lid speculum was placed. A paracentesis was created with the side port blade and the anterior chamber was filled with viscoelastic. A near clear corneal incision was performed with the steel keratome. A continuous curvilinear capsulorrhexis was performed with a cystotome followed by the capsulorrhexis forceps. Hydrodissection and hydrodelineation were carried out with BSS on a blunt cannula. The lens was removed in a stop and chop  technique and the remaining cortical material was removed with the irrigation-aspiration handpiece. The capsular bag was inflated with viscoelastic and the Technis [id number]  lens was placed in the capsular bag without complication. The remaining viscoelastic was removed from the eye with the irrigation-aspiration handpiece. The wounds were hydrated. The anterior chamber was flushed with Miostat and the eye was inflated to physiologic pressure. 0.351ml of Vigamox was placed in the anterior chamber. The wounds were found to be water tight. The eye was dressed with Vigamox. The patient was given protective glasses to wear throughout the day and a shield with which to  sleep tonight. The patient was also given drops with which to begin a drop regimen today and will follow-up with me in one day. Implant Name Type Inv. Item Serial No. Manufacturer Lot No. LRB No. Used  LENS IOL DIOP 23.5 - [id number] Intraocular Lens LENS IOL DIOP 23.5 [id number]  Right 1   Procedure(s) with comments: CATARACT EXTRACTION PHACO AND INTRAOCULAR LENS PLACEMENT (IOC) (Right) - US 00:29 AP% 13.5 CDE 3.99 Fluid pack lot # [id number]  Electronically signed: Galen ManilaWilliam Naavya Postma 02/16/2018 8:24 AM

## 2018-02-16 NOTE — H&P (Signed)
All labs reviewed. Abnormal studies sent to patients PCP when indicated.  Previous H&P reviewed, patient examined, there are NO CHANGES.  Lindsay Mcnally Porfilio6/11/20197:59 AM

## 2018-02-16 NOTE — Anesthesia Post-op Follow-up Note (Signed)
Anesthesia QCDR form completed.        

## 2018-02-16 NOTE — Anesthesia Postprocedure Evaluation (Signed)
Anesthesia Post Note  Patient: Lindsay Baker  Procedure(s) Performed: CATARACT EXTRACTION PHACO AND INTRAOCULAR LENS PLACEMENT (IOC) (Right Eye)  Patient location during evaluation: Short Stay Anesthesia Type: MAC Level of consciousness: awake and alert, oriented and patient cooperative Pain management: pain level controlled Vital Signs Assessment: post-procedure vital signs reviewed and stable Respiratory status: spontaneous breathing, nonlabored ventilation and respiratory function stable Cardiovascular status: blood pressure returned to baseline Postop Assessment: no headache, no apparent nausea or vomiting and adequate PO intake Anesthetic complications: no     Last Vitals:  Vitals:   02/16/18 0650 02/16/18 0827  BP: 135/64 (!) 131/49  Pulse: (!) 57 (!) 56  Resp: 18 16  Temp: (!) 36.1 C (!) 35.7 C  SpO2: 99% 100%    Last Pain:  Vitals:   02/16/18 0827  TempSrc: Oral                 Eben Burow

## 2018-02-16 NOTE — Anesthesia Preprocedure Evaluation (Addendum)
Anesthesia Evaluation  Patient identified by MRN, date of birth, ID band Patient awake    Reviewed: Allergy & Precautions, NPO status , Patient's Chart, lab work & pertinent test results  History of Anesthesia Complications Negative for: history of anesthetic complications  Airway Mallampati: III  TM Distance: >3 FB Neck ROM: Full    Dental no notable dental hx.    Pulmonary neg pulmonary ROS, neg sleep apnea, neg COPD,    breath sounds clear to auscultation- rhonchi (-) wheezing      Cardiovascular hypertension, Pt. on medications (-) CAD, (-) Past MI, (-) Cardiac Stents and (-) CABG  Rhythm:Regular Rate:Normal - Systolic murmurs and - Diastolic murmurs    Neuro/Psych  Headaches, PSYCHIATRIC DISORDERS Depression    GI/Hepatic Neg liver ROS, hiatal hernia, GERD  ,  Endo/Other  negative endocrine ROSneg diabetes  Renal/GU negative Renal ROS     Musculoskeletal negative musculoskeletal ROS (+)   Abdominal (+) + obese,   Peds  Hematology negative hematology ROS (+)   Anesthesia Other Findings Past Medical History: No date: Bilateral lower extremity edema 12/2017: Cataract     Comment:  left No date: Depression No date: Dysrhythmia No date: GERD (gastroesophageal reflux disease) No date: Headache No date: History of hiatal hernia No date: Hypertension No date: Pneumonia     Comment:  history of bronchitis No date: Vitamin D deficiency   Reproductive/Obstetrics                             Anesthesia Physical  Anesthesia Plan  ASA: II  Anesthesia Plan: MAC   Post-op Pain Management:    Induction: Intravenous  PONV Risk Score and Plan: 2 and Midazolam  Airway Management Planned: Natural Airway  Additional Equipment:   Intra-op Plan:   Post-operative Plan:   Informed Consent: I have reviewed the patients History and Physical, chart, labs and discussed the procedure  including the risks, benefits and alternatives for the proposed anesthesia with the patient or authorized representative who has indicated his/her understanding and acceptance.     Plan Discussed with: CRNA and Anesthesiologist  Anesthesia Plan Comments:         Anesthesia Quick Evaluation

## 2018-02-16 NOTE — Discharge Instructions (Signed)
Eye Surgery Discharge Instructions  Expect mild scratchy sensation or mild soreness. DO NOT RUB YOUR EYE!  The day of surgery:  Minimal physical activity, but bed rest is not required  No reading, computer work, or close hand work  No bending, lifting, or straining.  May watch TV  For 24 hours:  No driving, legal decisions, or alcoholic beverages  Safety precautions  Eat anything you prefer: It is better to start with liquids, then soup then solid foods.  _____ Eye patch should be worn until postoperative exam tomorrow.  ____ Solar shield eyeglasses should be worn for comfort in the sunlight/patch while sleeping  Resume all regular medications including aspirin or Coumadin if these were discontinued prior to surgery. You may shower, bathe, shave, or wash your hair. Tylenol may be taken for mild discomfort.  Call your doctor if you experience significant pain, nausea, or vomiting, fever > 101 or other signs of infection. [phone] or [phone] Specific instructions:  Follow-up Information    Galen ManilaPorfilio, William, MD Follow up.   Specialty:  Ophthalmology Why:  02/16/18 at2:30 Contact information: [address] [place] [address] [phone]          Eye Surgery Discharge Instructions  Expect mild scratchy sensation or mild soreness. DO NOT RUB YOUR EYE!  The day of surgery:  Minimal physical activity, but bed rest is not required  No reading, computer work, or close hand work  No bending, lifting, or straining.  May watch TV  For 24 hours:  No driving, legal decisions, or alcoholic beverages  Safety precautions  Eat anything you prefer: It is better to start with liquids, then soup then solid foods.  _____ Eye patch should be worn until postoperative exam tomorrow.  ____ Solar shield eyeglasses should be worn for comfort in the sunlight/patch while sleeping  Resume all regular medications including aspirin or Coumadin if these were  discontinued prior to surgery. You may shower, bathe, shave, or wash your hair. Tylenol may be taken for mild discomfort.  Call your doctor if you experience significant pain, nausea, or vomiting, fever > 101 or other signs of infection. [phone] or [phone] Specific instructions:  Follow-up Information    Galen ManilaPorfilio, William, MD Follow up.   Specialty:  Ophthalmology Why:  02/16/18 at2:30 Contact information: [address] [place] [address] [phone]

## 2018-04-21 ENCOUNTER — Other Ambulatory Visit: Payer: Self-pay | Admitting: Internal Medicine

## 2018-04-22 ENCOUNTER — Other Ambulatory Visit: Payer: Self-pay | Admitting: Internal Medicine

## 2018-04-22 DIAGNOSIS — R1011 Right upper quadrant pain: Secondary | ICD-10-CM

## 2018-05-04 ENCOUNTER — Ambulatory Visit
Admission: RE | Admit: 2018-05-04 | Discharge: 2018-05-04 | Disposition: A | Discharge status: Home / Self Care | Payer: Medicare (Managed Care) | Source: Ambulatory Visit | Attending: Internal Medicine | Admitting: Internal Medicine

## 2018-05-04 DIAGNOSIS — R932 Abnormal findings on diagnostic imaging of liver and biliary tract: Secondary | ICD-10-CM | POA: Insufficient documentation

## 2018-05-04 DIAGNOSIS — R1011 Right upper quadrant pain: Secondary | ICD-10-CM | POA: Diagnosis present

## 2018-05-21 ENCOUNTER — Other Ambulatory Visit: Payer: Self-pay | Admitting: *Deleted

## 2018-05-21 ENCOUNTER — Other Ambulatory Visit: Payer: Self-pay | Admitting: Family Medicine

## 2018-06-03 ENCOUNTER — Other Ambulatory Visit: Payer: Self-pay | Admitting: Family Medicine

## 2018-06-03 DIAGNOSIS — R1011 Right upper quadrant pain: Secondary | ICD-10-CM

## 2018-06-03 DIAGNOSIS — R935 Abnormal findings on diagnostic imaging of other abdominal regions, including retroperitoneum: Secondary | ICD-10-CM

## 2018-06-03 DIAGNOSIS — R748 Abnormal levels of other serum enzymes: Secondary | ICD-10-CM

## 2018-06-15 ENCOUNTER — Ambulatory Visit
Admission: RE | Admit: 2018-06-15 | Discharge: 2018-06-15 | Disposition: A | Discharge status: Home / Self Care | Payer: Medicare (Managed Care) | Source: Ambulatory Visit | Attending: Family Medicine | Admitting: Family Medicine

## 2018-06-15 DIAGNOSIS — R935 Abnormal findings on diagnostic imaging of other abdominal regions, including retroperitoneum: Secondary | ICD-10-CM | POA: Insufficient documentation

## 2018-06-15 DIAGNOSIS — R1011 Right upper quadrant pain: Secondary | ICD-10-CM | POA: Diagnosis not present

## 2018-06-15 DIAGNOSIS — R748 Abnormal levels of other serum enzymes: Secondary | ICD-10-CM | POA: Insufficient documentation

## 2018-06-15 DIAGNOSIS — R932 Abnormal findings on diagnostic imaging of liver and biliary tract: Secondary | ICD-10-CM | POA: Diagnosis not present

## 2018-06-15 DIAGNOSIS — K760 Fatty (change of) liver, not elsewhere classified: Secondary | ICD-10-CM | POA: Insufficient documentation

## 2018-06-15 DIAGNOSIS — I700 Atherosclerosis of aorta: Secondary | ICD-10-CM | POA: Diagnosis not present

## 2018-06-15 LAB — POCT I-STAT CREATININE: Creatinine, Ser: 0.7 mg/dL (ref 0.44–1.00)

## 2018-06-15 MED ORDER — IOPAMIDOL (ISOVUE-300) INJECTION 61%
100.0000 mL | Freq: Once | INTRAVENOUS | Status: AC | PRN
  Administered 2018-06-15: 100 mL via INTRAVENOUS

## 2018-06-28 ENCOUNTER — Other Ambulatory Visit: Payer: Self-pay | Admitting: Internal Medicine

## 2019-04-02 IMAGING — US US ABDOMEN COMPLETE
1 series · 13 of 25 positions shown · non-contrast
Comparison: None.

CLINICAL DATA: Right upper quadrant abdominal pain for several
years. Bloating after eating.

EXAM:
ABDOMEN ULTRASOUND COMPLETE

[Series 1: us abdomen complete · 0.20mm/px · 13 of 104 slices shown]
[im 1/104]
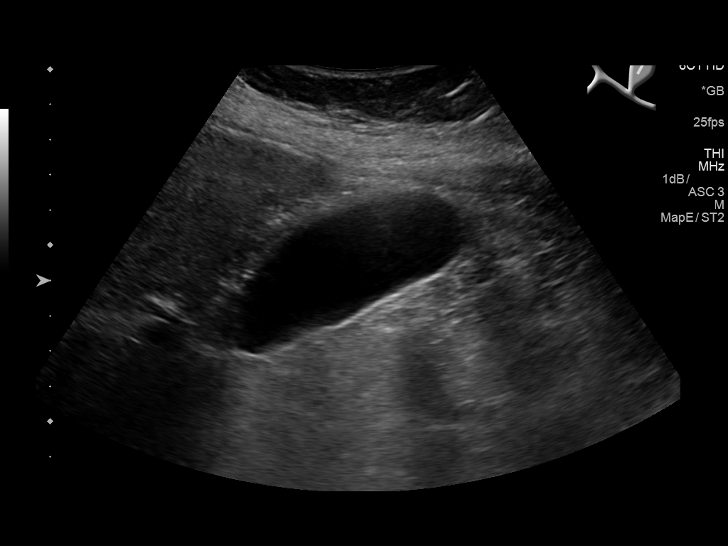
[im 9/104]
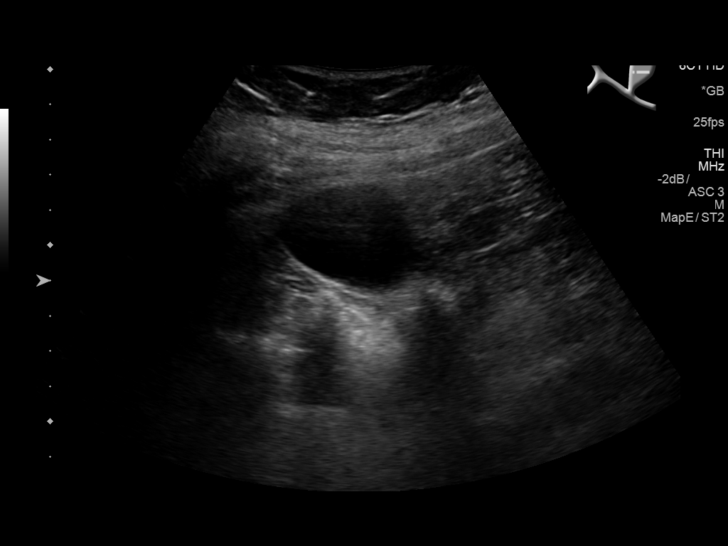
[im 18/104]
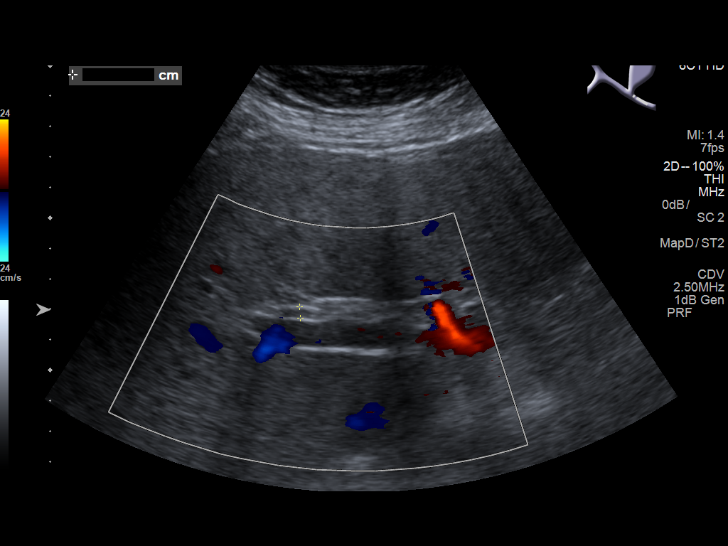
[im 26/104]
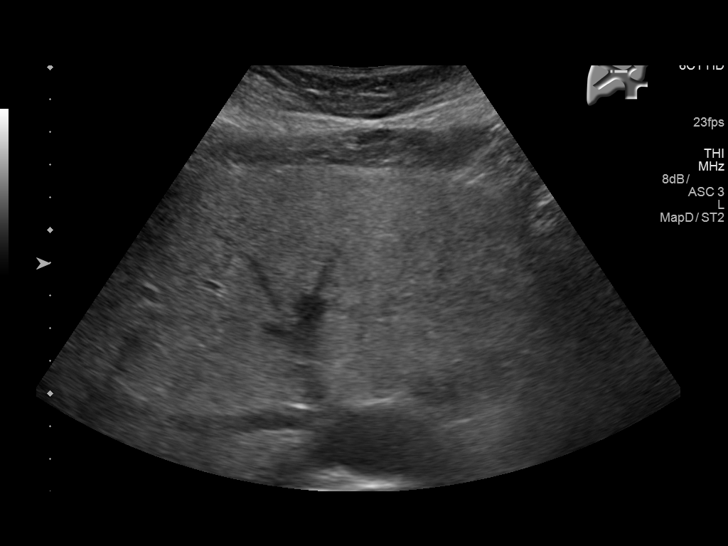
[im 35/104]
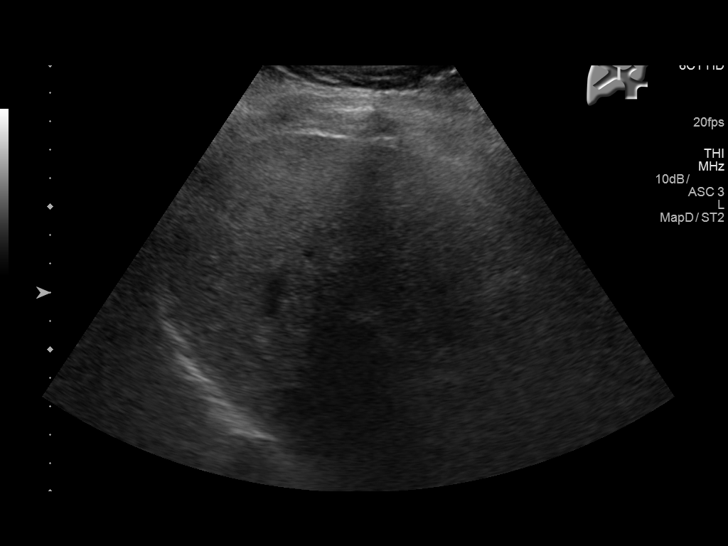
[im 43/104]
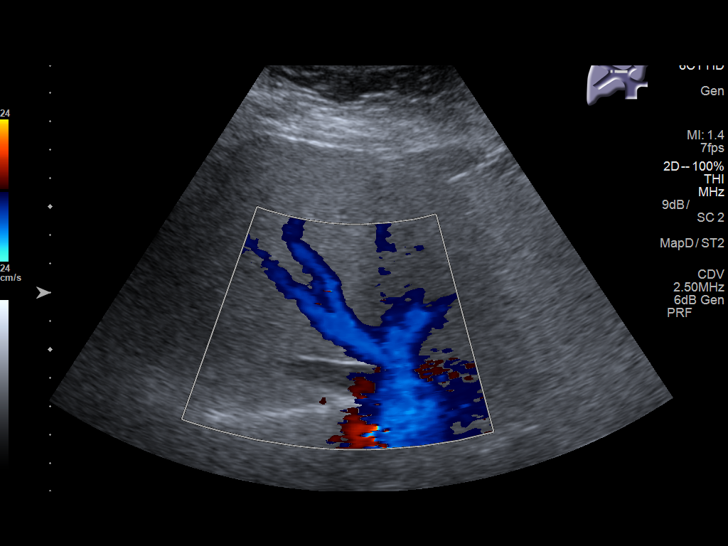
[im 52/104]
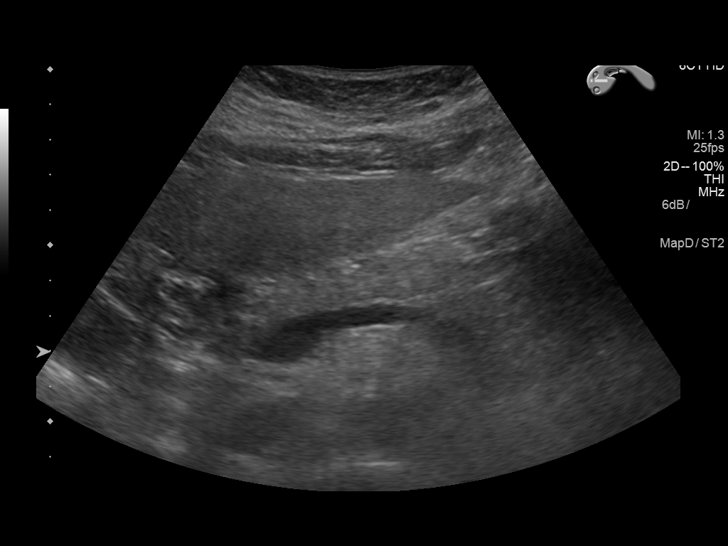
[im 61/104]
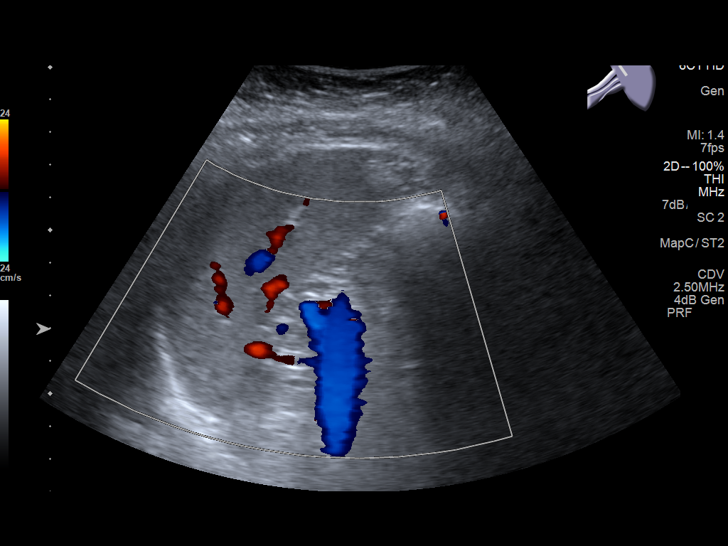
[im 69/104]
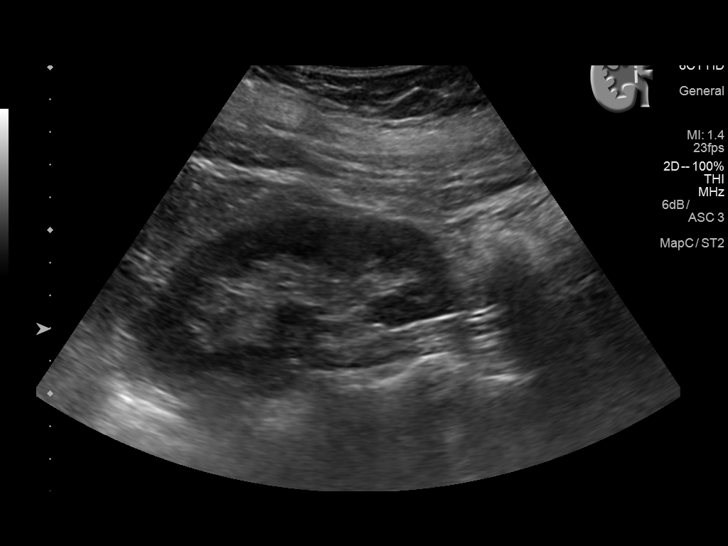
[im 78/104]
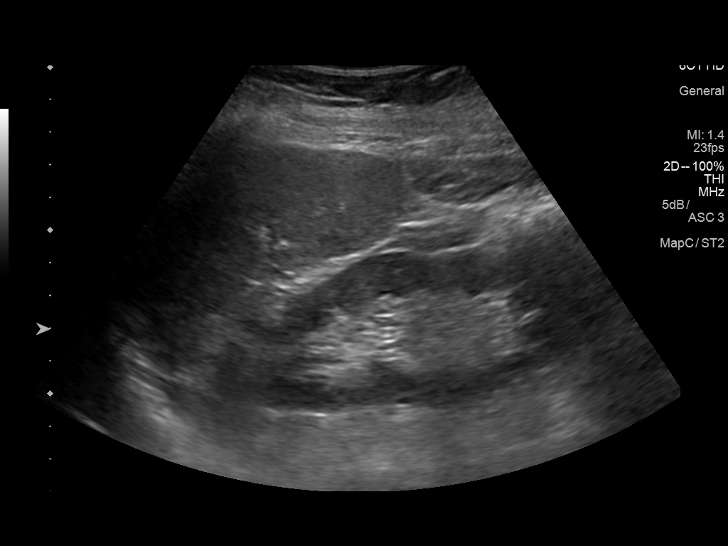
[im 86/104]
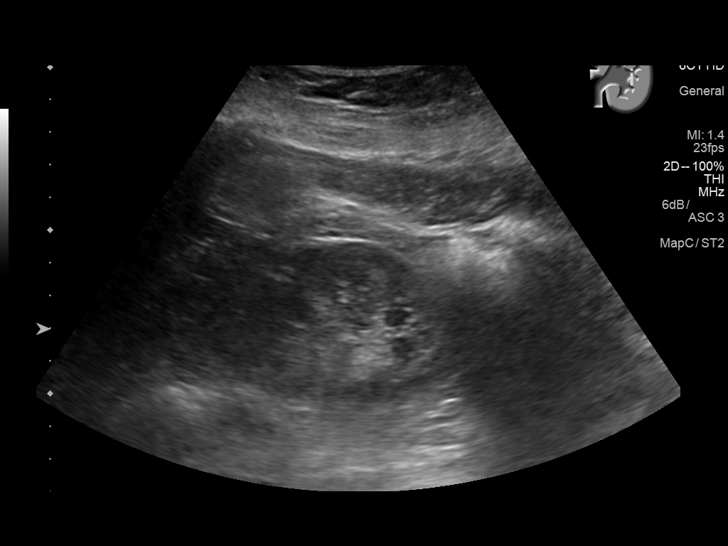
[im 95/104]
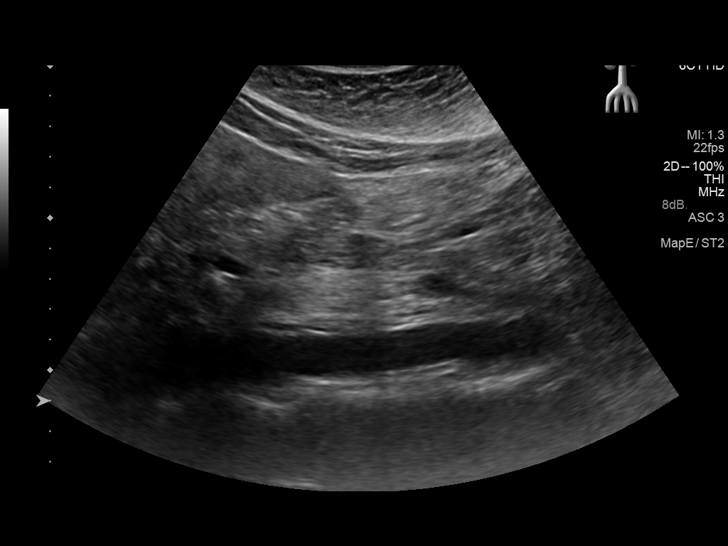
[im 104/104]
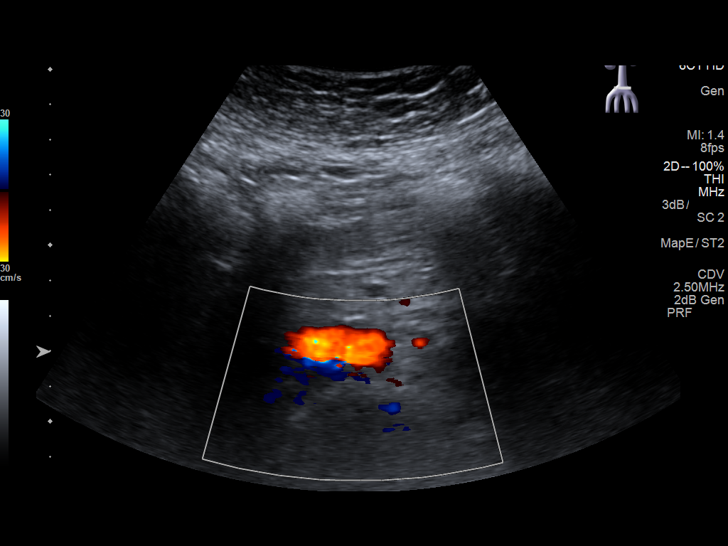

[13 of 25 positions shown; findings below may reference images not displayed]

FINDINGS: Gallbladder: No gallstones or wall thickening visualized. No
sonographic Murphy sign noted by sonographer.

Common bile duct: Diameter: 3.7 mm.  Normal.

Liver: 1.2 cm in diameter hypoechoic area in the left lobe. The
remainder of the liver [place] slightly echogenic. This is
indeterminate for a mass lesion versus an area of fatty sparing. No
ductal dilatation. Portal vein is patent on color Doppler imaging
with normal direction of blood flow towards the liver.

IVC: No abnormality visualized.

Pancreas: Visualized portion unremarkable.

Spleen: Size and appearance within normal limits.

Right Kidney: Length: 10.5 cm. Echogenicity within normal limits. No
mass or hydronephrosis visualized.

Left Kidney: Length: 11.5 cm. Echogenicity within normal limits. No
mass or hydronephrosis visualized.

Abdominal aorta: No aneurysm visualized.

Other findings: No ascites
IMPRESSION: Slightly echogenic liver suggesting mild fatty change. No evidence
of gallstones or ductal dilatation. 1.2 cm hypoechoic area in the
left lobe of the liver. This could be an area of focal fatty sparing
or could represent a mass or hemangioma. CT with and without
contrast or MRI could more accurately characterize this.

## 2019-12-02 ENCOUNTER — Other Ambulatory Visit: Payer: Self-pay | Admitting: Family Medicine

## 2019-12-02 DIAGNOSIS — K7581 Nonalcoholic steatohepatitis (NASH): Secondary | ICD-10-CM

## 2019-12-02 DIAGNOSIS — Z1231 Encounter for screening mammogram for malignant neoplasm of breast: Secondary | ICD-10-CM

## 2019-12-13 ENCOUNTER — Ambulatory Visit: Payer: Medicare (Managed Care)

## 2019-12-21 ENCOUNTER — Ambulatory Visit
Admission: RE | Admit: 2019-12-21 | Discharge: 2019-12-21 | Disposition: A | Discharge status: Home / Self Care | Payer: Medicare (Managed Care) | Source: Ambulatory Visit | Attending: Family Medicine | Admitting: Family Medicine

## 2019-12-21 ENCOUNTER — Other Ambulatory Visit: Payer: Self-pay

## 2019-12-21 DIAGNOSIS — K7581 Nonalcoholic steatohepatitis (NASH): Secondary | ICD-10-CM | POA: Diagnosis not present

## 2020-02-24 ENCOUNTER — Other Ambulatory Visit: Payer: Self-pay | Admitting: Family Medicine

## 2020-02-24 DIAGNOSIS — R935 Abnormal findings on diagnostic imaging of other abdominal regions, including retroperitoneum: Secondary | ICD-10-CM

## 2020-02-24 DIAGNOSIS — K7581 Nonalcoholic steatohepatitis (NASH): Secondary | ICD-10-CM

## 2020-05-21 ENCOUNTER — Ambulatory Visit: Payer: Medicare (Managed Care)

## 2020-06-25 ENCOUNTER — Other Ambulatory Visit: Payer: Self-pay

## 2020-06-25 ENCOUNTER — Ambulatory Visit
Admission: RE | Admit: 2020-06-25 | Discharge: 2020-06-25 | Disposition: A | Discharge status: Home / Self Care | Payer: Medicare (Managed Care) | Source: Ambulatory Visit | Attending: Family Medicine | Admitting: Family Medicine

## 2020-06-25 DIAGNOSIS — R935 Abnormal findings on diagnostic imaging of other abdominal regions, including retroperitoneum: Secondary | ICD-10-CM | POA: Diagnosis present

## 2020-06-25 DIAGNOSIS — K7581 Nonalcoholic steatohepatitis (NASH): Secondary | ICD-10-CM | POA: Diagnosis not present

## 2020-07-23 ENCOUNTER — Other Ambulatory Visit: Payer: Self-pay | Admitting: Family Medicine

## 2020-07-23 DIAGNOSIS — R935 Abnormal findings on diagnostic imaging of other abdominal regions, including retroperitoneum: Secondary | ICD-10-CM

## 2020-07-23 DIAGNOSIS — K7581 Nonalcoholic steatohepatitis (NASH): Secondary | ICD-10-CM

## 2020-08-27 ENCOUNTER — Ambulatory Visit
Admission: RE | Admit: 2020-08-27 | Discharge: 2020-08-27 | Disposition: A | Discharge status: Home / Self Care | Payer: Medicare (Managed Care) | Source: Ambulatory Visit | Attending: Family Medicine | Admitting: Family Medicine

## 2020-08-27 ENCOUNTER — Other Ambulatory Visit: Payer: Self-pay

## 2020-08-27 DIAGNOSIS — R935 Abnormal findings on diagnostic imaging of other abdominal regions, including retroperitoneum: Secondary | ICD-10-CM | POA: Insufficient documentation

## 2020-08-27 DIAGNOSIS — K7581 Nonalcoholic steatohepatitis (NASH): Secondary | ICD-10-CM | POA: Insufficient documentation

## 2020-08-27 MED ORDER — GADOBUTROL 1 MMOL/ML IV SOLN
7.0000 mL | Freq: Once | INTRAVENOUS | Status: AC | PRN
  Administered 2020-08-27: 7 mL via INTRAVENOUS

## 2020-08-28 ENCOUNTER — Other Ambulatory Visit: Payer: Self-pay | Admitting: Family Medicine

## 2020-08-28 DIAGNOSIS — K7581 Nonalcoholic steatohepatitis (NASH): Secondary | ICD-10-CM

## 2020-08-28 DIAGNOSIS — K760 Fatty (change of) liver, not elsewhere classified: Secondary | ICD-10-CM

## 2020-08-28 DIAGNOSIS — R161 Splenomegaly, not elsewhere classified: Secondary | ICD-10-CM

## 2020-10-29 ENCOUNTER — Ambulatory Visit
Admission: RE | Admit: 2020-10-29 | Discharge: 2020-10-29 | Disposition: A | Discharge status: Home / Self Care | Payer: Medicare (Managed Care) | Source: Ambulatory Visit | Attending: Family Medicine | Admitting: Family Medicine

## 2020-10-29 DIAGNOSIS — Z1231 Encounter for screening mammogram for malignant neoplasm of breast: Secondary | ICD-10-CM | POA: Insufficient documentation

## 2021-03-18 ENCOUNTER — Other Ambulatory Visit: Payer: Self-pay

## 2021-03-18 ENCOUNTER — Ambulatory Visit
Admission: RE | Admit: 2021-03-18 | Discharge: 2021-03-18 | Disposition: A | Discharge status: Home / Self Care | Payer: Medicare (Managed Care) | Source: Ambulatory Visit | Attending: Family Medicine | Admitting: Family Medicine

## 2021-03-18 DIAGNOSIS — K760 Fatty (change of) liver, not elsewhere classified: Secondary | ICD-10-CM | POA: Diagnosis not present

## 2021-03-18 DIAGNOSIS — K7581 Nonalcoholic steatohepatitis (NASH): Secondary | ICD-10-CM | POA: Diagnosis present

## 2021-03-18 DIAGNOSIS — R161 Splenomegaly, not elsewhere classified: Secondary | ICD-10-CM | POA: Diagnosis present

## 2021-03-18 MED ORDER — GADOBUTROL 1 MMOL/ML IV SOLN
7.0000 mL | Freq: Once | INTRAVENOUS | Status: AC | PRN
  Administered 2021-03-18: 7 mL via INTRAVENOUS

## 2021-08-19 ENCOUNTER — Other Ambulatory Visit: Payer: Self-pay | Admitting: Family Medicine

## 2021-08-19 DIAGNOSIS — K7460 Unspecified cirrhosis of liver: Secondary | ICD-10-CM

## 2021-08-19 DIAGNOSIS — R161 Splenomegaly, not elsewhere classified: Secondary | ICD-10-CM

## 2021-08-19 DIAGNOSIS — K760 Fatty (change of) liver, not elsewhere classified: Secondary | ICD-10-CM

## 2021-08-28 ENCOUNTER — Other Ambulatory Visit: Payer: Self-pay

## 2021-08-28 ENCOUNTER — Ambulatory Visit
Admission: RE | Admit: 2021-08-28 | Discharge: 2021-08-28 | Disposition: A | Discharge status: Home / Self Care | Payer: Medicare (Managed Care) | Source: Ambulatory Visit | Attending: Family Medicine | Admitting: Family Medicine

## 2021-08-28 DIAGNOSIS — R161 Splenomegaly, not elsewhere classified: Secondary | ICD-10-CM | POA: Diagnosis present

## 2021-08-28 DIAGNOSIS — K7460 Unspecified cirrhosis of liver: Secondary | ICD-10-CM | POA: Insufficient documentation

## 2021-08-28 DIAGNOSIS — K760 Fatty (change of) liver, not elsewhere classified: Secondary | ICD-10-CM | POA: Insufficient documentation

## 2021-10-29 ENCOUNTER — Encounter: Payer: Self-pay | Admitting: Internal Medicine

## 2021-10-30 ENCOUNTER — Ambulatory Visit: Payer: Medicare (Managed Care) | Admitting: Certified Registered"

## 2021-10-30 ENCOUNTER — Encounter
Admission: RE | Disposition: A | Discharge status: Home / Self Care | Payer: Self-pay | Source: Home / Self Care | Attending: Internal Medicine

## 2021-10-30 ENCOUNTER — Ambulatory Visit
Admission: RE | Admit: 2021-10-30 | Discharge: 2021-10-30 | Disposition: A | Discharge status: Home / Self Care | Payer: Medicare (Managed Care) | Attending: Internal Medicine | Admitting: Internal Medicine

## 2021-10-30 DIAGNOSIS — K2950 Unspecified chronic gastritis without bleeding: Secondary | ICD-10-CM | POA: Insufficient documentation

## 2021-10-30 DIAGNOSIS — K7460 Unspecified cirrhosis of liver: Secondary | ICD-10-CM | POA: Insufficient documentation

## 2021-10-30 DIAGNOSIS — K7581 Nonalcoholic steatohepatitis (NASH): Secondary | ICD-10-CM | POA: Insufficient documentation

## 2021-10-30 DIAGNOSIS — B9681 Helicobacter pylori [H. pylori] as the cause of diseases classified elsewhere: Secondary | ICD-10-CM | POA: Diagnosis not present

## 2021-10-30 DIAGNOSIS — K766 Portal hypertension: Secondary | ICD-10-CM | POA: Insufficient documentation

## 2021-10-30 DIAGNOSIS — F32A Depression, unspecified: Secondary | ICD-10-CM | POA: Insufficient documentation

## 2021-10-30 DIAGNOSIS — K219 Gastro-esophageal reflux disease without esophagitis: Secondary | ICD-10-CM | POA: Insufficient documentation

## 2021-10-30 DIAGNOSIS — K3189 Other diseases of stomach and duodenum: Secondary | ICD-10-CM | POA: Insufficient documentation

## 2021-10-30 DIAGNOSIS — Z79899 Other long term (current) drug therapy: Secondary | ICD-10-CM | POA: Diagnosis not present

## 2021-10-30 DIAGNOSIS — I10 Essential (primary) hypertension: Secondary | ICD-10-CM | POA: Diagnosis not present

## 2021-10-30 HISTORY — PX: ESOPHAGOGASTRODUODENOSCOPY (EGD) WITH PROPOFOL: SHX5813

## 2021-10-30 HISTORY — DX: Splenomegaly, not elsewhere classified: R16.1

## 2021-10-30 HISTORY — DX: Dysphagia, unspecified: R13.10

## 2021-10-30 HISTORY — DX: Unspecified cirrhosis of liver: K74.60

## 2021-10-30 SURGERY — ESOPHAGOGASTRODUODENOSCOPY (EGD) WITH PROPOFOL
Anesthesia: General

## 2021-10-30 MED ORDER — PROPOFOL 500 MG/50ML IV EMUL
INTRAVENOUS | Status: DC | PRN
  Administered 2021-10-30: 145 ug/kg/min via INTRAVENOUS

## 2021-10-30 MED ORDER — GLYCOPYRROLATE 0.2 MG/ML IJ SOLN
INTRAMUSCULAR | Status: DC | PRN
Start: 2021-10-30 — End: 2021-10-30
  Administered 2021-10-30: .2 mg via INTRAVENOUS

## 2021-10-30 MED ORDER — PROPOFOL 10 MG/ML IV BOLUS
INTRAVENOUS | Status: DC | PRN
  Administered 2021-10-30: 10 mg via INTRAVENOUS
  Administered 2021-10-30 (×2): 20 mg via INTRAVENOUS
  Administered 2021-10-30: 60 mg via INTRAVENOUS

## 2021-10-30 MED ORDER — SODIUM CHLORIDE 0.9 % IV SOLN
INTRAVENOUS | Status: DC
  Administered 2021-10-30: 20 mL/h via INTRAVENOUS

## 2021-10-30 MED ORDER — LIDOCAINE HCL (CARDIAC) PF 100 MG/5ML IV SOSY
PREFILLED_SYRINGE | INTRAVENOUS | Status: DC | PRN
  Administered 2021-10-30: 100 mg via INTRAVENOUS

## 2021-10-30 NOTE — Anesthesia Preprocedure Evaluation (Signed)
Anesthesia Evaluation  Patient identified by MRN, date of birth, ID band Patient awake    Reviewed: Allergy & Precautions, NPO status , Patient's Chart, lab work & pertinent test results  History of Anesthesia Complications Negative for: history of anesthetic complications  Airway Mallampati: III  TM Distance: >3 FB Neck ROM: Full    Dental no notable dental hx.    Pulmonary neg pulmonary ROS, neg sleep apnea, neg COPD,    breath sounds clear to auscultation- rhonchi (-) wheezing      Cardiovascular hypertension, Pt. on medications (-) CAD, (-) Past MI, (-) Cardiac Stents and (-) CABG  Rhythm:Regular Rate:Normal - Systolic murmurs and - Diastolic murmurs    Neuro/Psych  Headaches, PSYCHIATRIC DISORDERS Depression    GI/Hepatic Neg liver ROS, hiatal hernia, GERD  ,  Endo/Other  negative endocrine ROSneg diabetes  Renal/GU negative Renal ROS     Musculoskeletal negative musculoskeletal ROS (+)   Abdominal (+) + obese,   Peds  Hematology negative hematology ROS (+)   Anesthesia Other Findings Past Medical History: No date: Bilateral lower extremity edema 12/2017: Cataract     Comment:  left No date: Depression No date: Dysrhythmia No date: GERD (gastroesophageal reflux disease) No date: Headache No date: History of hiatal hernia No date: Hypertension No date: Pneumonia     Comment:  history of bronchitis No date: Vitamin D deficiency   Reproductive/Obstetrics                             Anesthesia Physical  Anesthesia Plan  ASA: II  Anesthesia Plan: General   Post-op Pain Management:    Induction: Intravenous  PONV Risk Score and Plan: 2 and Midazolam  Airway Management Planned: Natural Airway  Additional Equipment:   Intra-op Plan:   Post-operative Plan:   Informed Consent: I have reviewed the patients History and Physical, chart, labs and discussed the procedure  including the risks, benefits and alternatives for the proposed anesthesia with the patient or authorized representative who has indicated his/her understanding and acceptance.       Plan Discussed with: CRNA and Anesthesiologist  Anesthesia Plan Comments:         Anesthesia Quick Evaluation

## 2021-10-30 NOTE — Interval H&P Note (Signed)
History and Physical Interval Note:  10/30/2021 10:57 AM  Lindsay Baker  has presented today for surgery, with the diagnosis of Cirrhosis Esophageal Dysphagia.  The various methods of treatment have been discussed with the patient and family. After consideration of risks, benefits and other options for treatment, the patient has consented to  Procedure(s) with comments: ESOPHAGOGASTRODUODENOSCOPY (EGD) WITH PROPOFOL (N/A) - Spanish Interpreter as a surgical intervention.  The patient's history has been reviewed, patient examined, no change in status, stable for surgery.  I have reviewed the patient's chart and labs.  Questions were answered to the patient's satisfaction.     [place], [place]

## 2021-10-30 NOTE — H&P (Signed)
Outpatient short stay form Pre-procedure 10/30/2021 10:55 AM Quinnley Colasurdo K. Alice Reichert, M.D.  Primary Physician: Lynnell Dike, M.D.  Reason for visit:  Portal hypertensions, NAFLD cirrhosis  History of present illness:  Patient is a 71 y/o hispanic, non-english speaking patient with history of cirrhosis due to NASH. No previous history of esophageal varices. Patient denies intractable heartburn, dysphagia, hemetemesis, abdominal pain, nausea or vomiting.       Current Facility-Administered Medications:    0.9 %  sodium chloride infusion, , Intravenous, Continuous, Bhargav Barbaro, Benay Pike, MD  Medications Prior to Admission  Medication Sig Dispense Refill Last Dose   omeprazole (PRILOSEC) 10 MG capsule Take 10 mg by mouth daily.      acetaminophen (TYLENOL) 500 MG tablet Take 500 mg by mouth every 6 (six) hours as needed.      amLODipine (NORVASC) 10 MG tablet Take 10 mg by mouth daily.      cholecalciferol (VITAMIN D) 1000 units tablet Take 1,000 Units by mouth daily.      ciclopirox (PENLAC) 8 % solution Apply topically at bedtime. Apply over nail and surrounding skin. Apply daily over previous coat. After seven (7) days, may remove with alcohol and continue cycle.      escitalopram (LEXAPRO) 10 MG tablet Take 10 mg by mouth daily.      hydrochlorothiazide (HYDRODIURIL) 25 MG tablet Take 25 mg by mouth daily.      Ketotifen Fumarate (KETOTIFEN HYDROGEN FUMARATE) POWD 1 application by Does not apply route as needed.      lisinopril (PRINIVIL,ZESTRIL) 20 MG tablet Take 30 mg by mouth daily.       ranitidine (ZANTAC) 150 MG tablet Take 150 mg by mouth 2 (two) times daily.      silver sulfADIAZINE (SILVADENE) 1 % cream Apply 1 application topically daily.      Skin Protectants, Misc. (EUCERIN) cream Apply 1 application topically as needed for dry skin.      traMADol (ULTRAM) 50 MG tablet Take by mouth every 6 (six) hours as needed.        No Known Allergies   Past Medical History:  Diagnosis Date    Bilateral lower extremity edema    Cataract 12/2017   left   Cirrhosis ([place])    Depression    Dysphagia    Dysrhythmia    patient denies arrythmia   GERD (gastroesophageal reflux disease)    Headache    History of hiatal hernia    Hypertension    Pneumonia    history of bronchitis   Pre-diabetes    Splenomegaly    Vitamin D deficiency     Review of systems:  Otherwise negative.    Physical Exam  Gen: Alert, oriented. Appears stated age.  HEENT: [place]/AT. PERRLA. Lungs: CTA, no wheezes. CV: RR nl S1, S2. Abd: soft, benign, no masses. BS+ Ext: No edema. Pulses 2+    Planned procedures: Proceed with EGD The patient understands the nature of the planned procedure, indications, risks, alternatives and potential complications including but not limited to bleeding, infection, perforation, damage to internal organs and possible oversedation/side effects from anesthesia. The patient agrees and gives consent to proceed.  Please refer to procedure notes for findings, recommendations and patient disposition/instructions.     Talib Headley K. Alice Reichert, M.D. Gastroenterology 10/30/2021  10:55 AM

## 2021-10-30 NOTE — Transfer of Care (Signed)
Immediate Anesthesia Transfer of Care Note  Patient: Lindsay Baker  Procedure(s) Performed: ESOPHAGOGASTRODUODENOSCOPY (EGD) WITH PROPOFOL  Patient Location: Endoscopy Unit  Anesthesia Type:General  Level of Consciousness: awake, drowsy and patient cooperative  Airway & Oxygen Therapy: Patient Spontanous Breathing and Patient connected to face mask oxygen  Post-op Assessment: Report given to RN and Post -op Vital signs reviewed and stable  Post vital signs: Reviewed and stable  Last Vitals:  Vitals Value Taken Time  BP 98/48 10/30/21 1154  Temp 36.1 C 10/30/21 1154  Pulse 65 10/30/21 1156  Resp 18 10/30/21 1156  SpO2 97 % 10/30/21 1156  Vitals shown include unvalidated device data.  Last Pain:  Vitals:   10/30/21 1154  TempSrc: Temporal  PainSc: Asleep         Complications: No notable events documented.

## 2021-10-30 NOTE — OR Nursing (Signed)
Rosey Bath #[id number] ([facility]) used in Pre-op; including Anesthesia Interview.

## 2021-10-30 NOTE — Op Note (Signed)
[facility] Gastroenterology Patient Name: Lindsay Baker Procedure Date: 10/30/2021 11:31 AM MRN: [id number] Account #: [id number] Date of Birth: [birth year] Admit Type: Outpatient Age: 71 Room: [facility] ENDO ROOM 2 Gender: Female Note Status: Finalized Instrument Name: Upper Endoscope [id number] Procedure:             Upper GI endoscopy Indications:           CIrrhosis of the liver, Portal venous hypertension Providers:             Boykin Nearing. Abrie Egloff MD, MD Medicines:             Propofol per Anesthesia Complications:         No immediate complications. Procedure:             Pre-Anesthesia Assessment:                        - The risks and benefits of the procedure and the                         sedation options and risks were discussed with the                         patient. All questions were answered and informed                         consent was obtained.                        - Patient identification and proposed procedure were                         verified prior to the procedure by the nurse. The                         procedure was verified in the procedure room.                        - ASA Grade Assessment: III - A patient with severe                         systemic disease.                        - After reviewing the risks and benefits, the patient                         was deemed in satisfactory condition to undergo the                         procedure.                        After obtaining informed consent, the endoscope was                         passed under direct vision. Throughout the procedure,                         the patient's blood pressure, pulse, and oxygen  saturations were monitored continuously. The Endoscope                         was introduced through the mouth, and advanced to the                         third part of duodenum. The upper GI endoscopy was                         accomplished  without difficulty. The patient tolerated                         the procedure well. Findings:      The esophagus was normal.      Mild portal hypertensive gastropathy was found in the entire examined       stomach.      A single 15 mm mucosal papule (nodule) with no bleeding and no stigmata       of recent bleeding was found in the prepyloric region of the stomach.       Biopsies were taken with a cold forceps for histology.      There is no endoscopic evidence of varices in the gastroesophageal       junction and in the cardia.      The examined duodenum was normal.      The exam was otherwise without abnormality. Impression:            - Normal esophagus.                        - Portal hypertensive gastropathy.                        - A single mucosal papule (nodule) found in the                         stomach. Biopsied.                        - Normal examined duodenum.                        - The examination was otherwise normal. Recommendation:        - Patient has a contact number available for                         emergencies. The signs and symptoms of potential                         delayed complications were discussed with the patient.                         Return to normal activities tomorrow. Written                         discharge instructions were provided to the patient.                        - Resume previous diet.                        -  Continue present medications.                        - Await pathology results.                        - Repeat upper endoscopy in 3 years for screening                         purposes.                        - The findings and recommendations were discussed with                         the patient. Procedure Code(s):     --- Professional ---                        [phone], Esophagogastroduodenoscopy, flexible,                         transoral; with biopsy, single or multiple Diagnosis Code(s):     --- Professional  ---                        K31.89, Other diseases of stomach and duodenum                        K76.6, Portal hypertension CPT copyright 2019 American Medical Association. All rights reserved. The codes documented in this report are preliminary and upon coder review may  be revised to meet current compliance requirements. Lindsay Kidney MD, MD 10/30/2021 11:56:19 AM This report has been signed electronically. Number of Addenda: 0 Note Initiated On: 10/30/2021 11:31 AM Estimated Blood Loss:  Estimated blood loss was minimal.      [facility]

## 2021-10-30 NOTE — Anesthesia Procedure Notes (Signed)
Procedure Name: General with mask airway Date/Time: 10/30/2021 11:41 AM Performed by: Mohammed Kindle, CRNA Pre-anesthesia Checklist: Patient identified, Emergency Drugs available, Suction available and Patient being monitored Patient Re-evaluated:Patient Re-evaluated prior to induction Oxygen Delivery Method: Simple face mask Induction Type: IV induction Placement Confirmation: positive ETCO2 and CO2 detector Dental Injury: Teeth and Oropharynx as per pre-operative assessment

## 2021-10-30 NOTE — Anesthesia Postprocedure Evaluation (Signed)
Anesthesia Post Note  Patient: Lindsay Baker  Procedure(s) Performed: ESOPHAGOGASTRODUODENOSCOPY (EGD) WITH PROPOFOL  Patient location during evaluation: PACU Anesthesia Type: General Level of consciousness: awake and alert Pain management: pain level controlled Vital Signs Assessment: post-procedure vital signs reviewed and stable Respiratory status: spontaneous breathing, nonlabored ventilation and respiratory function stable Cardiovascular status: blood pressure returned to baseline and stable Postop Assessment: no apparent nausea or vomiting Anesthetic complications: no   No notable events documented.   Last Vitals:  Vitals:   10/30/21 1214 10/30/21 1224  BP: (!) 112/56 (!) 112/54  Pulse: (!) 56 (!) 57  Resp: 12 13  Temp:    SpO2: 97% 95%    Last Pain:  Vitals:   10/30/21 1224  TempSrc:   PainSc: 0-No pain                 Foye Deer

## 2021-10-31 ENCOUNTER — Encounter: Payer: Self-pay | Admitting: Internal Medicine

## 2021-10-31 LAB — SURGICAL PATHOLOGY

## 2022-01-08 ENCOUNTER — Other Ambulatory Visit: Payer: Self-pay | Admitting: Family Medicine

## 2022-01-08 DIAGNOSIS — R161 Splenomegaly, not elsewhere classified: Secondary | ICD-10-CM

## 2022-01-08 DIAGNOSIS — K760 Fatty (change of) liver, not elsewhere classified: Secondary | ICD-10-CM

## 2022-01-08 DIAGNOSIS — K3189 Other diseases of stomach and duodenum: Secondary | ICD-10-CM

## 2022-01-08 DIAGNOSIS — K7460 Unspecified cirrhosis of liver: Secondary | ICD-10-CM

## 2022-01-29 IMAGING — MR MR ABDOMEN WO/W CM
17 series · 48 of 48 positions shown · IV contrast (7ml Gadavist)
Comparison: June 25, 2020

CLINICAL DATA: Abnormal sonogram. 69-year-old female with question
of mass lesion in the liver reported on prior ultrasound images of
the abdomen.

EXAM:
MRI ABDOMEN WITHOUT AND WITH CONTRAST
TECHNIQUE: Multiplanar multisequence MR imaging of the abdomen was performed
both before and after the administration of intravenous contrast.
CONTRAST:  7mL GADAVIST GADOBUTROL 1 MMOL/ML IV SOLN

[Series 4: T2 · axial · 6.0mm · 1.25mm/px · 1 of 32 slices shown]
[im 1/32]
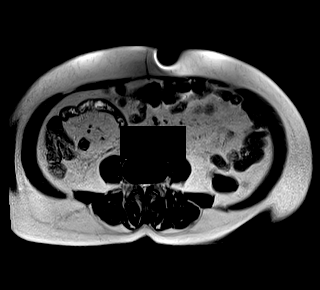

[Series 6: T2 fat-sat · axial · 6.0mm · 1.19mm/px · 1 of 32 slices shown]
[im 1/32]
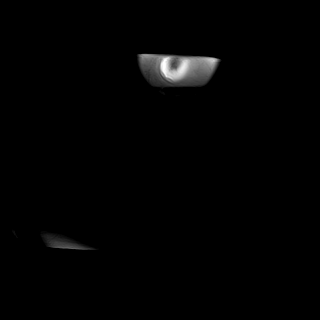

[Series 8: ax dwi_tracew · axial · 6.0mm · 1.42mm/px · z∈[-105,+133]mm · 3 of 102 slices shown]
[im 1/102]
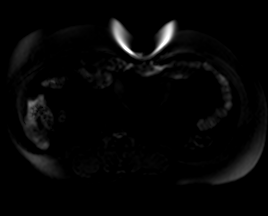
[im 51/102]
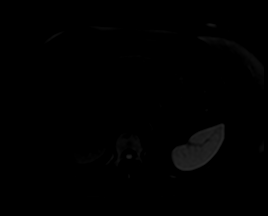
[im 102/102]
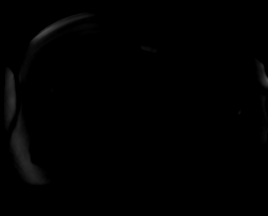

[Series 9: ax dwi_adc · axial · 6.0mm · 1.42mm/px · 1 of 34 slices shown]
[im 1/34]
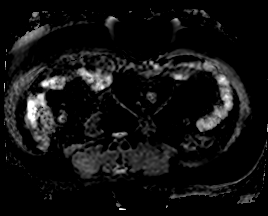

[Series 10: T1 · axial · 6.0mm · 0.74mm/px · 1 of 32 slices shown (1 of 2)]
[im 1/32]
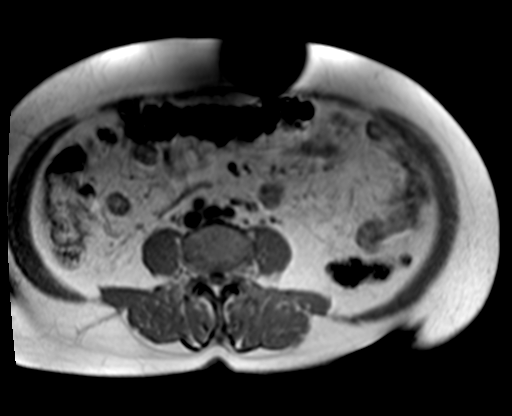

[Series 10: T1 · axial · 6.0mm · 0.74mm/px · 1 of 32 slices shown (2 of 2)]
[im 1/32]
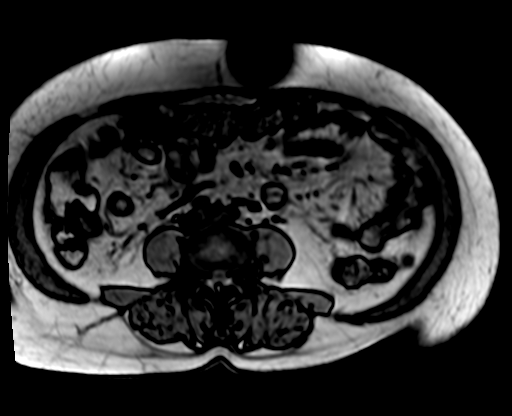

[Series 11: bSSFP · axial · 6.0mm · 0.74mm/px · 1 of 32 slices shown]
[im 1/32]
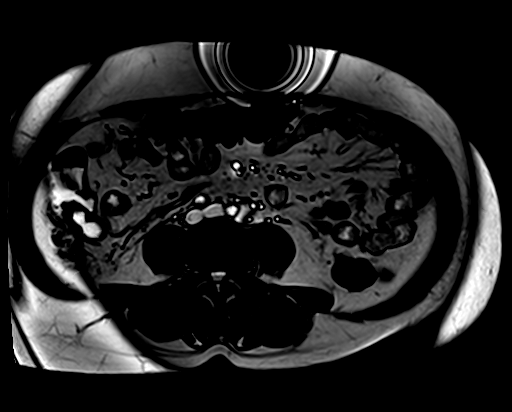

[Series 12: T1 dynamic fat-sat · axial · non-contrast · 3.0mm · 1.19mm/px · z∈[-130,+107]mm · 4 of 80 slices shown (1 of 5)]
[im 1/80]
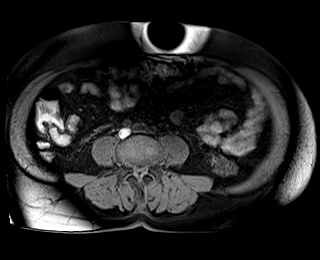
[im 27/80]
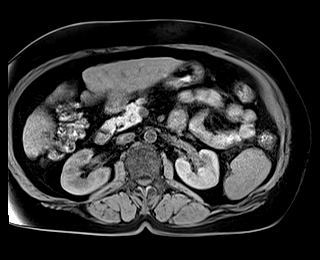
[im 53/80]
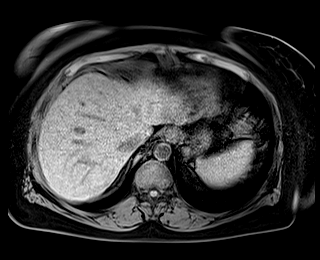
[im 80/80]
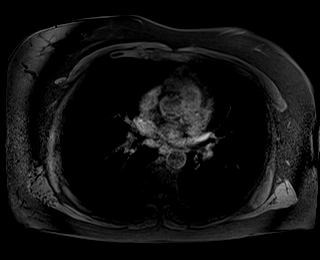

[Series 13: T1 dynamic fat-sat post-contrast · axial · 3.0mm · 1.19mm/px · z∈[-130,+107]mm · 4 of 80 slices shown (1 of 4)]
[im 1/80]
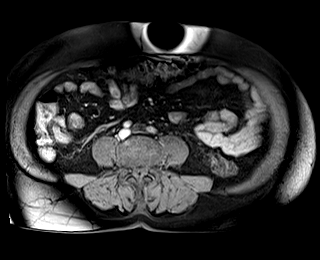
[im 27/80]
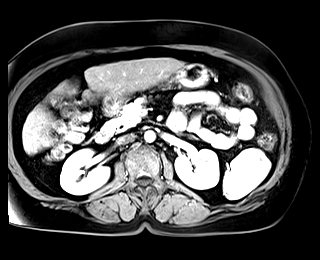
[im 53/80]
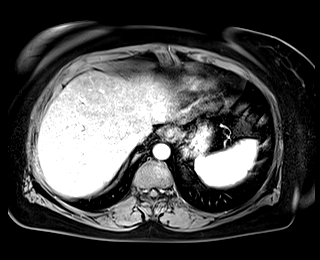
[im 80/80]
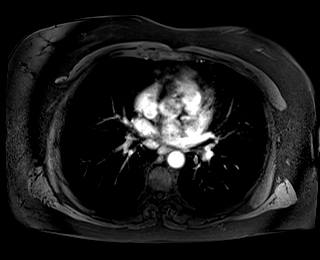

[Series 14: T1 dynamic fat-sat · axial · 3.0mm · 1.19mm/px · z∈[-130,+107]mm · 4 of 80 slices shown (2 of 5)]
[im 1/80]
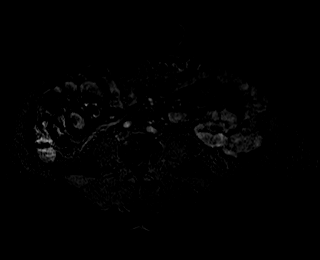
[im 27/80]
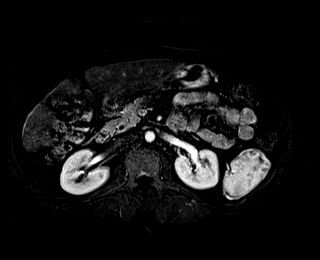
[im 53/80]
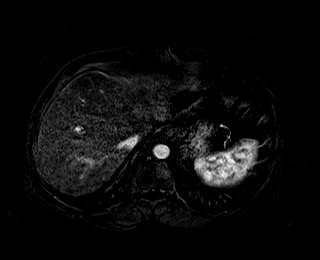
[im 80/80]
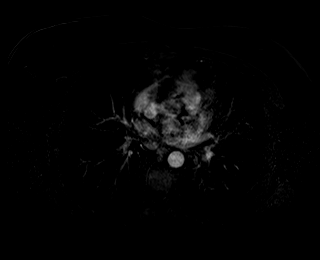

[Series 15: T1 dynamic fat-sat post-contrast · axial · 3.0mm · 1.19mm/px · z∈[-130,+107]mm · 4 of 80 slices shown (2 of 4)]
[im 1/80]
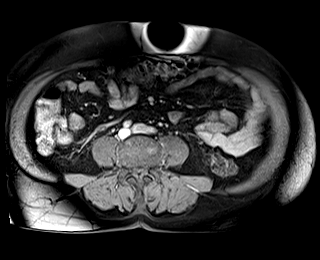
[im 27/80]
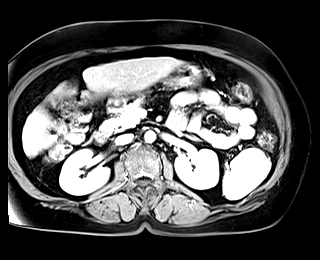
[im 53/80]
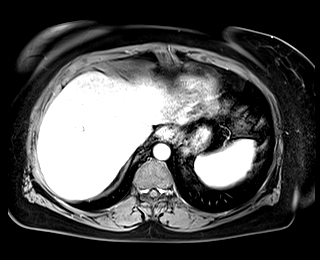
[im 80/80]
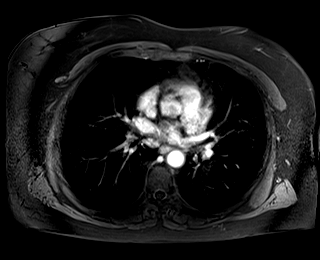

[Series 16: T1 dynamic fat-sat · axial · 3.0mm · 1.19mm/px · z∈[-130,+107]mm · 4 of 80 slices shown (3 of 5)]
[im 1/80]
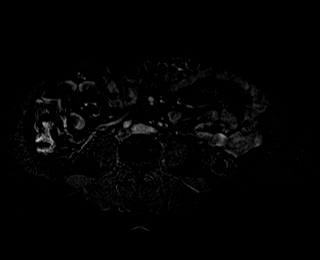
[im 27/80]
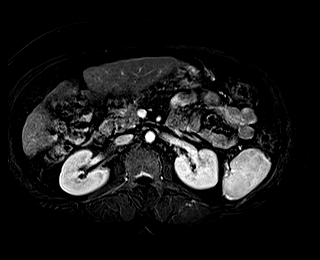
[im 53/80]
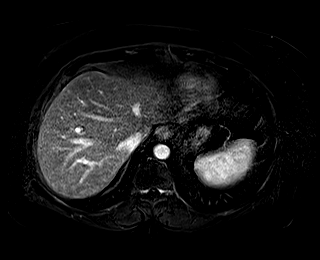
[im 80/80]
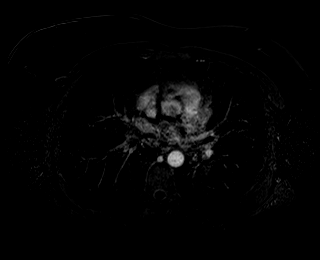

[Series 17: T1 dynamic fat-sat post-contrast · axial · 3.0mm · 1.19mm/px · z∈[-130,+107]mm · 4 of 80 slices shown (3 of 4)]
[im 1/80]
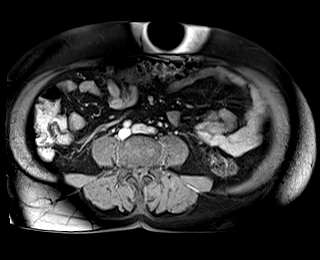
[im 27/80]
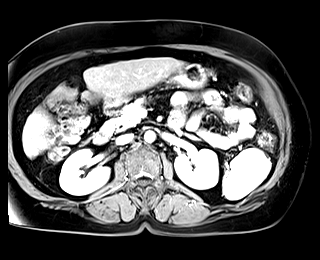
[im 53/80]
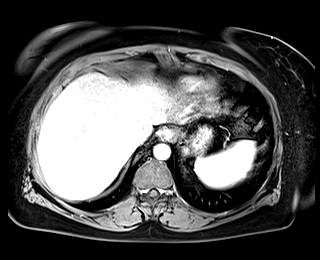
[im 80/80]
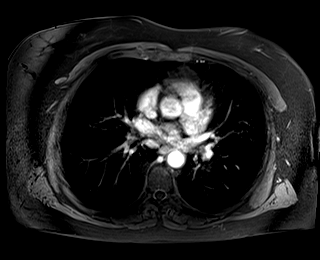

[Series 18: T1 dynamic fat-sat · axial · 3.0mm · 1.19mm/px · z∈[-130,+107]mm · 4 of 80 slices shown (4 of 5)]
[im 1/80]
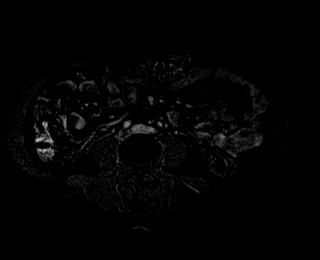
[im 27/80]
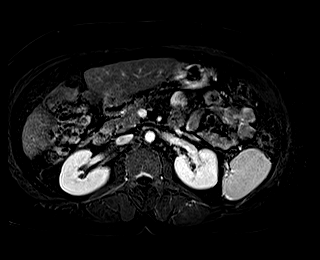
[im 53/80]
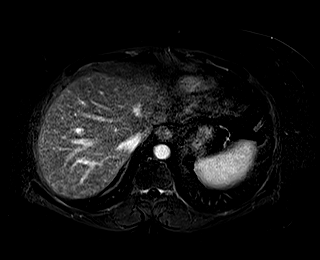
[im 80/80]
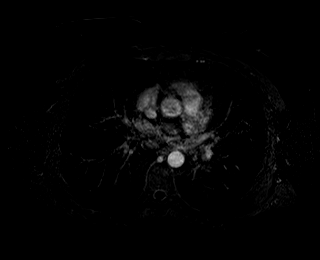

[Series 19: T1 dynamic post-contrast · coronal · 3.0mm · 1.31mm/px · 3 of 72 slices shown]
[im 1/72]
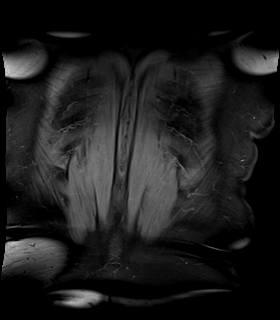
[im 36/72]
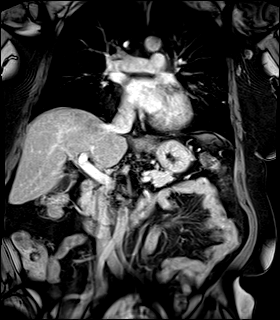
[im 72/72]
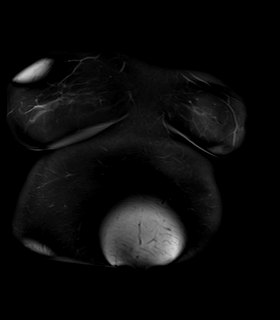

[Series 20: T1 dynamic fat-sat post-contrast · axial · 3.0mm · 1.19mm/px · z∈[-130,+107]mm · 4 of 80 slices shown (4 of 4)]
[im 1/80]
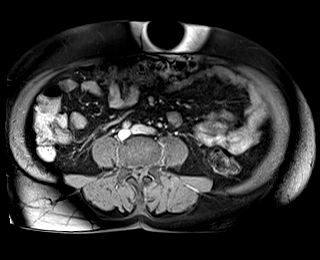
[im 27/80]
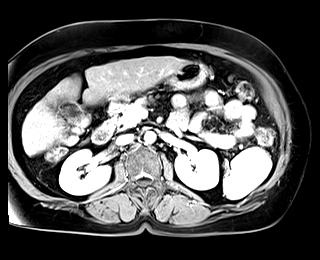
[im 53/80]
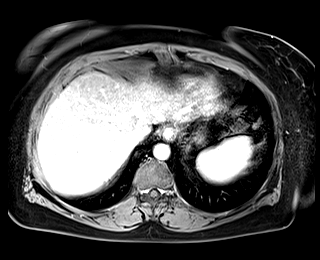
[im 80/80]
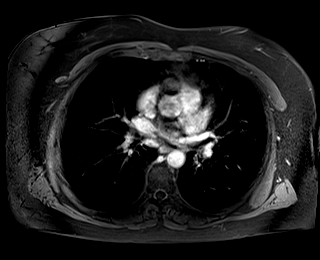

[Series 21: T1 dynamic fat-sat · axial · 3.0mm · 1.19mm/px · z∈[-130,+107]mm · 4 of 80 slices shown (5 of 5)]
[im 1/80]
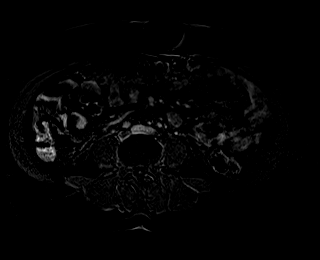
[im 27/80]
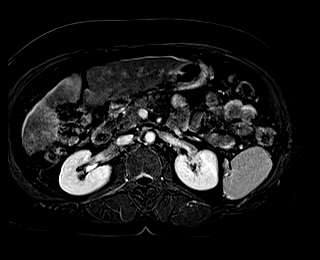
[im 53/80]
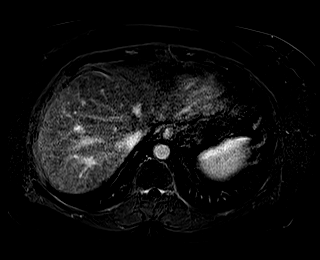
[im 80/80]
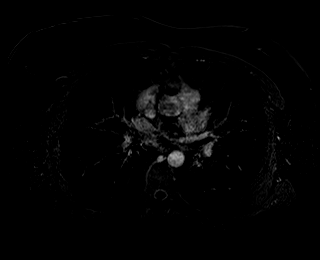

[48 of 48 positions shown; findings below may reference images not displayed]

FINDINGS: Lower chest: Incidental imaging of the lung bases without effusion
or consolidation.

Hepatobiliary: Mild hepatic steatosis and nodular hepatic contour.
Scattered areas of subtle intrinsic T1 hyperintensity within nodules
relative to the out of phase appearance of the liver I the loss of
signal within areas affected by steatosis and sparing of some
nodules in the liver parenchyma. No biliary duct dilation. No
pericholecystic stranding.

One of these areas in the LEFT hepatic lobe best seen on the out of
phase images in hepatic subsegment III measures approximately a cm,
perhaps corresponding to the area of concern. Adjacent smaller areas
are also noted. These areas lack arterial phase enhancement. Areas
also lack washout. Areas are not visible on diffusion or on in phase
T1 weighted gradient echo imaging or on T2 weighted imaging. Portal
vein is patent.

Pancreas: Normal appearance of the pancreas, no focal lesion, ductal
dilation or sign of inflammation.

Spleen:  13 cm spleen.

Adrenals/Urinary Tract: Adrenal glands are normal. No
hydronephrosis.

Stomach/Bowel: Limited assessment of gastrointestinal tract without
acute process to the extent evaluated.

Vascular/Lymphatic: No pathologically enlarged lymph nodes
identified. No abdominal aortic aneurysm demonstrated.

Other:  No ascites

Musculoskeletal: No suspicious bone lesions identified.
IMPRESSION: 1. Signs of liver disease with hepatic steatosis, nodular pattern
and contour suggesting cirrhosis and with early signs of portal
hypertension most notably splenomegaly.
2. Area of nodular change with fatty sparing likely regenerating
nodule within early cirrhotic liver. Fatty sparing is assigned that
could be seen in the setting of neoplasm in the liver though is
nonspecific in the current setting with diffuse background
steatosis. Observation of concern on the ultrasound is currently
characterized as MIHAYL category 2 would consider six-month
follow-up with MRI with without contrast to document stability.
3. No findings to suggest hepatocellular carcinoma on the current
imaging study.
4. Portal vein is patent.

## 2022-02-10 ENCOUNTER — Ambulatory Visit
Admission: RE | Admit: 2022-02-10 | Discharge: 2022-02-10 | Disposition: A | Discharge status: Home / Self Care | Payer: Medicare (Managed Care) | Source: Ambulatory Visit | Attending: Family Medicine | Admitting: Family Medicine

## 2022-02-10 DIAGNOSIS — K766 Portal hypertension: Secondary | ICD-10-CM | POA: Diagnosis present

## 2022-02-10 DIAGNOSIS — R161 Splenomegaly, not elsewhere classified: Secondary | ICD-10-CM | POA: Diagnosis present

## 2022-02-10 DIAGNOSIS — K7460 Unspecified cirrhosis of liver: Secondary | ICD-10-CM

## 2022-02-10 DIAGNOSIS — K760 Fatty (change of) liver, not elsewhere classified: Secondary | ICD-10-CM | POA: Diagnosis not present

## 2022-02-10 DIAGNOSIS — K3189 Other diseases of stomach and duodenum: Secondary | ICD-10-CM | POA: Diagnosis present

## 2022-02-13 ENCOUNTER — Other Ambulatory Visit: Payer: Self-pay | Admitting: Family Medicine

## 2022-02-13 DIAGNOSIS — K7469 Other cirrhosis of liver: Secondary | ICD-10-CM

## 2022-02-13 DIAGNOSIS — K769 Liver disease, unspecified: Secondary | ICD-10-CM

## 2022-02-26 ENCOUNTER — Ambulatory Visit
Admission: RE | Admit: 2022-02-26 | Discharge: 2022-02-26 | Disposition: A | Discharge status: Home / Self Care | Payer: Medicare (Managed Care) | Source: Ambulatory Visit | Attending: Family Medicine | Admitting: Family Medicine

## 2022-02-26 DIAGNOSIS — K769 Liver disease, unspecified: Secondary | ICD-10-CM | POA: Diagnosis present

## 2022-02-26 DIAGNOSIS — K7469 Other cirrhosis of liver: Secondary | ICD-10-CM | POA: Insufficient documentation

## 2022-11-26 ENCOUNTER — Other Ambulatory Visit: Payer: Self-pay | Admitting: Family Medicine

## 2022-11-26 DIAGNOSIS — Z1231 Encounter for screening mammogram for malignant neoplasm of breast: Secondary | ICD-10-CM

## 2022-12-02 ENCOUNTER — Other Ambulatory Visit: Payer: Self-pay | Admitting: Family Medicine

## 2022-12-02 DIAGNOSIS — R161 Splenomegaly, not elsewhere classified: Secondary | ICD-10-CM

## 2022-12-02 DIAGNOSIS — K7581 Nonalcoholic steatohepatitis (NASH): Secondary | ICD-10-CM

## 2022-12-02 DIAGNOSIS — K760 Fatty (change of) liver, not elsewhere classified: Secondary | ICD-10-CM

## 2022-12-22 ENCOUNTER — Ambulatory Visit
Admission: RE | Admit: 2022-12-22 | Discharge: 2022-12-22 | Disposition: A | Discharge status: Home / Self Care | Payer: Medicare (Managed Care) | Source: Ambulatory Visit | Attending: Family Medicine | Admitting: Family Medicine

## 2022-12-22 ENCOUNTER — Ambulatory Visit: Payer: Medicare (Managed Care)

## 2022-12-22 DIAGNOSIS — K760 Fatty (change of) liver, not elsewhere classified: Secondary | ICD-10-CM | POA: Insufficient documentation

## 2022-12-22 DIAGNOSIS — R161 Splenomegaly, not elsewhere classified: Secondary | ICD-10-CM | POA: Diagnosis present

## 2022-12-22 DIAGNOSIS — K7581 Nonalcoholic steatohepatitis (NASH): Secondary | ICD-10-CM

## 2023-01-12 ENCOUNTER — Ambulatory Visit
Admission: RE | Admit: 2023-01-12 | Discharge: 2023-01-12 | Disposition: A | Discharge status: Home / Self Care | Payer: Medicare (Managed Care) | Source: Ambulatory Visit | Attending: Family Medicine | Admitting: Family Medicine

## 2023-01-12 DIAGNOSIS — Z1231 Encounter for screening mammogram for malignant neoplasm of breast: Secondary | ICD-10-CM

## 2023-03-05 ENCOUNTER — Other Ambulatory Visit: Payer: Self-pay | Admitting: Family Medicine

## 2023-03-05 DIAGNOSIS — K760 Fatty (change of) liver, not elsewhere classified: Secondary | ICD-10-CM

## 2023-03-05 DIAGNOSIS — K7460 Unspecified cirrhosis of liver: Secondary | ICD-10-CM

## 2023-03-05 DIAGNOSIS — K3189 Other diseases of stomach and duodenum: Secondary | ICD-10-CM

## 2023-03-08 ENCOUNTER — Emergency Department
Admission: EM | Admit: 2023-03-08 | Discharge: 2023-03-08 | Disposition: A | Discharge status: Home / Self Care | Payer: Medicare (Managed Care) | Attending: Emergency Medicine | Admitting: Emergency Medicine

## 2023-03-08 ENCOUNTER — Other Ambulatory Visit: Payer: Self-pay

## 2023-03-08 ENCOUNTER — Emergency Department: Payer: Medicare (Managed Care)

## 2023-03-08 DIAGNOSIS — R0789 Other chest pain: Secondary | ICD-10-CM | POA: Diagnosis present

## 2023-03-08 DIAGNOSIS — F439 Reaction to severe stress, unspecified: Secondary | ICD-10-CM | POA: Diagnosis not present

## 2023-03-08 DIAGNOSIS — F430 Acute stress reaction: Secondary | ICD-10-CM

## 2023-03-08 LAB — BASIC METABOLIC PANEL
Anion gap: 10 (ref 5–15)
BUN: 12 mg/dL (ref 8–23)
CO2: 23 mmol/L (ref 22–32)
Calcium: 9.6 mg/dL (ref 8.9–10.3)
Chloride: 103 mmol/L (ref 98–111)
Creatinine, Ser: 0.71 mg/dL (ref 0.44–1.00)
GFR, Estimated: >60 mL/min (ref >60)
Glucose, Bld: 118 mg/dL — ABNORMAL HIGH (ref 70–99)
Potassium: 3.5 mmol/L (ref 3.5–5.1)
Sodium: 136 mmol/L (ref 135–145)

## 2023-03-08 LAB — TROPONIN I (HIGH SENSITIVITY)
Troponin I (High Sensitivity): 16 ng/L (ref <18)
Troponin I (High Sensitivity): 16 ng/L (ref <18)

## 2023-03-08 LAB — CBC
HCT: 40.4 % (ref 36.0–46.0)
Hemoglobin: 13.8 g/dL (ref 12.0–15.0)
MCH: 29.4 pg (ref 26.0–34.0)
MCHC: 34.2 g/dL (ref 30.0–36.0)
MCV: 86 fL (ref 80.0–100.0)
Platelets: 151 10*3/uL (ref 150–400)
RBC: 4.7 MIL/uL (ref 3.87–5.11)
RDW: 12.3 % (ref 11.5–15.5)
WBC: 6.7 10*3/uL (ref 4.0–10.5)
nRBC: 0 % (ref 0.0–0.2)

## 2023-03-08 NOTE — ED Provider Notes (Signed)
[facility] Provider Note    Event Date/Time   First MD Initiated Contact with Patient 03/08/23 2113     (approximate)   History   Chest Pain   HPI  Lindsay Baker is a 72 y.o. female who presents with complaints of chest tightness.  Patient reports this occurred after hearing that her spouse had died today.  She reports she had tightness in her chest and left arm discomfort which is now resolved.  This occurred just prior to arrival.  No shortness of breath.       Physical Exam   Triage Vital Signs: ED Triage Vitals [03/08/23 2100]  Enc Vitals Group     BP (!) 147/67     Pulse Rate 68     Resp 16     Temp 98 F (36.7 C)     Temp Source Oral     SpO2 97 %     Weight      Height 1.473 m (4\' 10" )     Head Circumference      Peak Flow      Pain Score 2     Pain Loc      Pain Edu?      Excl. in GC?     Most recent vital signs: Vitals:   03/08/23 2100  BP: (!) 147/67  Pulse: 68  Resp: 16  Temp: 98 F (36.7 C)  SpO2: 97%     General: Awake, no distress.  CV:  Good peripheral perfusion.  Resp:  Normal effort.  Regular rate and rhythm, clear to auscultation bilaterally Abd:  No distention.  Other:     ED Results / Procedures / Treatments   Labs (all labs ordered are listed, but only abnormal results are displayed) Labs Reviewed  BASIC METABOLIC PANEL - Abnormal; Notable for the following components:      Result Value   Glucose, Bld 118 (*)    All other components within normal limits  CBC  TROPONIN I (HIGH SENSITIVITY)  TROPONIN I (HIGH SENSITIVITY)     EKG ED ECG REPORT I, Jene Every, the attending physician, personally viewed and interpreted this ECG.  Date: 03/08/2023  Rhythm: normal sinus rhythm QRS Axis: normal Intervals: Left bundle branch block ST/T Wave abnormalities: normal Narrative Interpretation: no evidence of acute ischemia    RADIOLOGY     PROCEDURES:  Critical Care performed:    Procedures   MEDICATIONS ORDERED IN ED: Medications - No data to display   IMPRESSION / MDM / ASSESSMENT AND PLAN / ED COURSE  I reviewed the triage vital signs and the nursing notes. Patient's presentation is most consistent with acute presentation with potential threat to life or bodily function.  Patient presents with chest tightness in the setting of severe stress from the death of her spouse today.  Differential includes ACS, stress reaction  Feeling better here, EKG demonstrates left bundle branch block, no old EKG to compare to however initial high sensitive troponin is normal, pending repeat troponin  Patient is asymptomatic if second troponin is unchanged appropriate for discharge        FINAL CLINICAL IMPRESSION(S) / ED DIAGNOSES   Final diagnoses:  Atypical chest pain  Stress reaction     Rx / DC Orders   ED Discharge Orders     None        Note:  This document was prepared using Dragon voice recognition software and may include unintentional dictation errors.  Jene Every, MD 03/08/23 2259

## 2023-03-08 NOTE — ED Triage Notes (Signed)
Pt to ed from home via POV for CP that started today when she got here. Her husband died today here in the hospital. Pt is caox4, in no acute distress and in wheel chair in triage.

## 2023-03-23 ENCOUNTER — Ambulatory Visit
Admission: RE | Admit: 2023-03-23 | Discharge: 2023-03-23 | Disposition: A | Discharge status: Home / Self Care | Payer: Medicare (Managed Care) | Source: Ambulatory Visit | Attending: Family Medicine | Admitting: Family Medicine

## 2023-03-23 DIAGNOSIS — K3189 Other diseases of stomach and duodenum: Secondary | ICD-10-CM | POA: Diagnosis present

## 2023-03-23 DIAGNOSIS — K7460 Unspecified cirrhosis of liver: Secondary | ICD-10-CM | POA: Diagnosis present

## 2023-03-23 DIAGNOSIS — K760 Fatty (change of) liver, not elsewhere classified: Secondary | ICD-10-CM | POA: Diagnosis present

## 2023-03-23 DIAGNOSIS — K766 Portal hypertension: Secondary | ICD-10-CM | POA: Insufficient documentation

## 2023-03-23 MED ORDER — GADOBUTROL 1 MMOL/ML IV SOLN
7.0000 mL | Freq: Once | INTRAVENOUS | Status: AC | PRN
  Administered 2023-03-23: 7 mL via INTRAVENOUS

## 2023-04-21 ENCOUNTER — Other Ambulatory Visit: Payer: Self-pay | Admitting: Family Medicine

## 2023-04-21 DIAGNOSIS — K7460 Unspecified cirrhosis of liver: Secondary | ICD-10-CM

## 2023-04-21 DIAGNOSIS — K769 Liver disease, unspecified: Secondary | ICD-10-CM

## 2023-04-21 DIAGNOSIS — K760 Fatty (change of) liver, not elsewhere classified: Secondary | ICD-10-CM

## 2023-06-09 ENCOUNTER — Ambulatory Visit: Admission: RE | Admit: 2023-06-09 | Payer: Medicare (Managed Care) | Source: Ambulatory Visit

## 2023-06-12 ENCOUNTER — Ambulatory Visit: Payer: Medicare (Managed Care)

## 2023-06-19 ENCOUNTER — Ambulatory Visit
Admission: RE | Admit: 2023-06-19 | Discharge: 2023-06-19 | Disposition: A | Discharge status: Home / Self Care | Payer: Medicare (Managed Care) | Source: Ambulatory Visit | Attending: Family Medicine | Admitting: Family Medicine

## 2023-06-19 DIAGNOSIS — K769 Liver disease, unspecified: Secondary | ICD-10-CM | POA: Insufficient documentation

## 2023-06-19 DIAGNOSIS — K7460 Unspecified cirrhosis of liver: Secondary | ICD-10-CM | POA: Diagnosis present

## 2023-06-19 DIAGNOSIS — K760 Fatty (change of) liver, not elsewhere classified: Secondary | ICD-10-CM | POA: Diagnosis present

## 2023-06-19 MED ORDER — GADOBUTROL 1 MMOL/ML IV SOLN
7.0000 mL | Freq: Once | INTRAVENOUS | Status: AC | PRN
  Administered 2023-06-19: 7 mL via INTRAVENOUS

## 2023-07-13 ENCOUNTER — Other Ambulatory Visit: Payer: Self-pay | Admitting: Family Medicine

## 2023-07-13 DIAGNOSIS — K7460 Unspecified cirrhosis of liver: Secondary | ICD-10-CM

## 2023-07-13 DIAGNOSIS — E8889 Other specified metabolic disorders: Secondary | ICD-10-CM

## 2023-07-13 DIAGNOSIS — R161 Splenomegaly, not elsewhere classified: Secondary | ICD-10-CM

## 2023-07-13 DIAGNOSIS — K7581 Nonalcoholic steatohepatitis (NASH): Secondary | ICD-10-CM

## 2023-09-24 ENCOUNTER — Other Ambulatory Visit: Payer: Self-pay | Admitting: Family Medicine

## 2023-09-24 DIAGNOSIS — Z1231 Encounter for screening mammogram for malignant neoplasm of breast: Secondary | ICD-10-CM

## 2023-12-14 ENCOUNTER — Ambulatory Visit: Admission: RE | Admit: 2023-12-14 | Payer: Medicare (Managed Care) | Source: Ambulatory Visit

## 2024-01-13 ENCOUNTER — Ambulatory Visit
Admission: RE | Admit: 2024-01-13 | Discharge: 2024-01-13 | Disposition: A | Discharge status: Home / Self Care | Payer: Medicare (Managed Care) | Source: Ambulatory Visit | Attending: Family Medicine | Admitting: Family Medicine

## 2024-01-13 DIAGNOSIS — Z1231 Encounter for screening mammogram for malignant neoplasm of breast: Secondary | ICD-10-CM | POA: Insufficient documentation

## 2024-07-12 ENCOUNTER — Encounter: Payer: Self-pay | Admitting: *Deleted
